# Patient Record
Sex: Female | Born: 1965 | Race: Asian | Hispanic: No | Marital: Married | Smoking: Never smoker
Health system: Southern US, Community
[De-identification: ages and names within clinical notes are randomized; demographics above are authoritative.]

## PROBLEM LIST (undated history)

## (undated) DIAGNOSIS — N939 Abnormal uterine and vaginal bleeding, unspecified: Secondary | ICD-10-CM

## (undated) DIAGNOSIS — E049 Nontoxic goiter, unspecified: Secondary | ICD-10-CM

## (undated) HISTORY — PX: BREAST BIOPSY: SHX20

## (undated) HISTORY — DX: Nontoxic goiter, unspecified: E04.9

## (undated) HISTORY — DX: Abnormal uterine and vaginal bleeding, unspecified: N93.9

## (undated) HISTORY — PX: APPENDECTOMY: SHX54

---

## 1994-05-18 HISTORY — PX: CARDIAC VALVE SURGERY: SHX40

## 2000-10-14 ENCOUNTER — Other Ambulatory Visit: Admission: RE | Admit: 2000-10-14 | Discharge: 2000-10-14 | Payer: Self-pay | Admitting: Obstetrics and Gynecology

## 2000-12-20 ENCOUNTER — Ambulatory Visit (HOSPITAL_COMMUNITY): Admission: RE | Admit: 2000-12-20 | Discharge: 2000-12-20 | Payer: Self-pay | Admitting: Obstetrics and Gynecology

## 2000-12-20 ENCOUNTER — Encounter: Payer: Self-pay | Admitting: Obstetrics and Gynecology

## 2001-03-28 ENCOUNTER — Inpatient Hospital Stay (HOSPITAL_COMMUNITY): Admission: AD | Admit: 2001-03-28 | Discharge: 2001-03-29 | Payer: Self-pay | Admitting: Obstetrics and Gynecology

## 2001-04-02 ENCOUNTER — Emergency Department (HOSPITAL_COMMUNITY): Admission: EM | Admit: 2001-04-02 | Discharge: 2001-04-02 | Payer: Self-pay | Admitting: Emergency Medicine

## 2003-03-14 ENCOUNTER — Inpatient Hospital Stay (HOSPITAL_COMMUNITY): Admission: AD | Admit: 2003-03-14 | Discharge: 2003-03-14 | Payer: Self-pay | Admitting: *Deleted

## 2004-06-10 ENCOUNTER — Inpatient Hospital Stay (HOSPITAL_COMMUNITY): Admission: EM | Admit: 2004-06-10 | Discharge: 2004-06-12 | Payer: Self-pay | Admitting: Emergency Medicine

## 2007-09-13 ENCOUNTER — Other Ambulatory Visit: Admission: RE | Admit: 2007-09-13 | Discharge: 2007-09-13 | Payer: Self-pay | Admitting: Obstetrics and Gynecology

## 2007-09-27 ENCOUNTER — Encounter: Payer: Self-pay | Admitting: Obstetrics & Gynecology

## 2007-09-27 ENCOUNTER — Ambulatory Visit (HOSPITAL_COMMUNITY): Admission: RE | Admit: 2007-09-27 | Discharge: 2007-09-27 | Payer: Self-pay | Admitting: Obstetrics & Gynecology

## 2010-09-30 NOTE — Op Note (Signed)
[name], [name]               ACCOUNT NO.:  [id number]   MEDICAL RECORD NO.:  [id number]          PATIENT TYPE:  AMB   LOCATION:  SDC                           FACILITY:  [facility]   PHYSICIAN:  M. Leda Quail, MD  DATE OF BIRTH:  1965/09/02   DATE OF PROCEDURE:  DATE OF DISCHARGE:                               OPERATIVE REPORT   PREOPERATIVE DIAGNOSIS:  1. A 45 year old G6, P3, AB3, [place] female with dysfunctional      uterine bleeding.  2. Thickened endometrium versus polyp noted on ultrasound.  3. 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   POSTOPERATIVE DIAGNOSIS:  1. A 44 year old G6, P3, AB3, [place] female with dysfunctional      uterine bleeding.  2. Thickened endometrium versus polyp noted on ultrasound.  3. 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   PROCEDURE:  Hysteroscopy with D&C.   SURGEON:  M. Leda Quail, MD.   ASSISTANT:  OR staff nurse.   ANESTHESIA:  General using LMA.   FINDINGS:  Thickened endometrium, on curetting this tissue almost  appeared fatty and there was a very large amount of it for a woman as  thin as Ms. Basista is.   SPECIMENS:  Endometrial curettings sent to pathology.   ESTIMATED BLOOD LOSS:  Minimal.   FLUIDS:  1000 mL of LR.   URINE OUTPUT:  200 mL of clear urine output.   DRAINS:  With an IMA catheterization at the very beginning of the  procedure.   FLUID DEFICIT:  A 100 mL of 3% sorbitol, which was the hysteroscopic  medium used for the procedure.   COMPLICATIONS:  None.   INDICATIONS:  Ms. Alvizo is a very nice 45 year old G6, P3, [place]  female who has had dysfunctional bleeding now for almost 2 months.  The  bleeding just has not stopped.  She has now become anemic as a result of  this.  She was evaluated with a sonohysterogram, which showed a  thickened  endometrium measuring 17 mm.  On sonohysterogram, there was  either a long cylindrical polyp or just a significant amount of  thickened endometrial tissue.  It was difficult to differentiate which  one on ultrasound.  Risks and benefits were discussed with the patient  in the office.  She does not speak [place].  She did have a friend who  was capable of translating.  She is with the patient today as well.  I  do feel good and informed consent was obtained in the office.  Postoperative pain medicine was provided to the patient.  She presents  for this procedure today and is ready to proceed.   PROCEDURE:  The patient was taken to the operating room.  She is placed  in supine position.  Anesthesia administered by the anesthesia staff  without difficulty.  Legs positioned in [place] stirrups in the low  lithotomy position.  Once the legs were positioned appropriately, there  are lifted to the high lithotomy position.  The perineum, thighs, and  vagina prepped and draped in normal sterile fashion.  Red rubber Foley  catheter was used to drain the bladder of all urine.  A heavy weighted  speculum was placed in the posterior aspect of the vagina.  The anterior  lip of cervix grasped with a single-tooth tenaculum.  A 10 mL of 1%  lidocaine was instilled in the cervix to place a paracervical block.  The uterus sounds 7.5 cm.  The cervix was then dilated with Shawnie Pons  dilators up to #23.  A 2.9-mm diagnostic hysteroscope was obtained.  A  3% sorbitol was used as the hysteroscope medium.  The hysteroscope was  passed through cervical os and into the cervical canal very easily.  A  thickened fluffy tissue was noted with some fairly vascular and  irregular appearing areas.  Photo documentation of this was made.  Tubal  ostia noted bilaterally and photo documentation was made.  The  hysteroscope was then removed.  A#1 toothed curette was obtained and the  entire endometrium was curetted to a rough gritty  texture was noted.  A  very significant amount of tissue was obtained.  A #1 smooth curette was  obtained and the cavity was curetted again until rough gritty texture  was noted, which was present at this point.  The hysteroscope was then  obtained and passed again into the endometrial cavity.  The thickened  tissue is now absent.  At this point, I felt the procedure should be  ended.  All instruments removed from the vagina.  The tenaculum was  removed from the anterior lip of the cervix.  One silver nitrate was  used to obtain hemostasis of the tenaculum site.  The patient tolerated  the procedure very well.  She was awake from anesthesia and taken to  recovery in stable condition.  Sponge, lap, needle, and instrument  counts are correct x2.  There were no needles used on the field.  A 15  mg of Toradol IV was given at this time.      Lum Keas, MD  Electronically Signed     MSM/MEDQ  D:  09/27/2007  T:  09/28/2007  Job:  [id number]

## 2010-10-03 NOTE — H&P (Signed)
[facility]  PatientCHENILLE, [name] Visit Number: [id number] MRN: [id number]          Service Type: OBS Location: 910B 9159 01 Attending Physician:  Jaymes Graff A Dictated by:   Mack Guise, C.N.M. Admit Date:  03/28/2001                           History and Physical  CONTINUATION  ABDOMEN:                      Gravid in its contour.  Uterine fundus is noted to extend 40 cm above the level of the pubis symphysis.  Leopolds maneuvers find the infant to be in a longitudinal lie, cephalic presentation, and the estimated fetal weight is 7.5 pounds.  PELVIC:                       Digital exam of the cervix finds it to be 3-4 cm dilated, 80% effaced, with the cephalic presenting part at a -2 station. Clear fluid is noted, fern positive.  EXTREMITIES:                  Show no pathologic edema.  DTRs are 1+ with no clonus.  ASSESSMENT:                   1. Intrauterine pregnancy at term.                               2. Early labor.  PLAN:                         1. Admit per Dr. Jaymes Graff.                               2. Routine M.D. orders.                               3. Penicillin G prophylaxis for unknown group B                                  strep status. Dictated by:   Mack Guise, C.N.M. Attending Physician:  Michael Litter DD:  03/28/01 TD:  03/28/01 Job: [id number]

## 2010-10-03 NOTE — H&P (Signed)
[facility]  PatientMALKA, [name] Visit Number: [id number] MRN: [id number]          Service Type: OBS Location: 910B 9159 01 Attending Physician:  Jaymes Graff A Dictated by:   Mack Guise, C.N.M. Admit Date:  03/28/2001                           History and Physical  HISTORY OF PRESENT ILLNESS:   [name] is a 45 year old gravida 6 para 2-0-3-2 at 8 weeks, EDD April 12, 2001 as determined by dates, confirmed with pregnancy ultrasonography, who presents in early labor with spontaneous rupture of membranes at approximately 3:15 a.m. today for clear fluid shortly after arrival.  Patient is [place] with very limited [place].  Husband is with patient and is acting as an Engineer, technical sales.  She reports positive fetal movement, no bleeding.  Her pregnancy has been followed by the M.D. service at [facility] and is remarkable for: 1. Advanced maternal age. 2. Language barrier. 3. Three ABs. 4. Heart surgery. 5. Questionable group B strep status.  This patient was initially evaluated at the office of [facility] at approximately 10 or [redacted] weeks gestation.  EDC determined by dates confirmed with pregnancy ultrasonography.  Her pregnancy has been essentially uncomplicated.  She has been normotensive with no proteinuria, size equal to dates throughout.  PRENATAL LABORATORY DATA:     On Oct 14, 2000:  Hemoglobin and hematocrit 12.4 and 38.2; platelets 240,000.  Blood type and Rh O positive, antibody screen negative.  Sickle cell trait negative.  VDRL nonreactive.  Rubella immune. Hepatitis B surface antigen negative.  HIV nonreactive.  Pap smear within normal limits.  GC and chlamydia negative.  One-hour glucose challenge at 28 weeks 135; three-hour GTT taken February 03, 2001 was 80, 136, 112, and 121 - which is within normal limits.  OBSTETRICAL HISTORY:          In 1990, elective AB with D&C.  In 1991, elective AB with D&C.  In 1993, a forceps  delivery in [place] with the birth of a 5 pound female infant at term.  In 1998, normal spontaneous vaginal delivery at term with the birth of a 7 pound female infant in [place] with no complications.  In 2000, elective AB.  In 2002, the present pregnancy.  MEDICAL HISTORY:              Significant for heart problems for which patient underwent heart surgery in 1996.  FAMILY HISTORY:               Patients brother was born with a hole in his heart.  GENETIC HISTORY:              That is the only problem of significance which has been stated - that patients brother has a hole in his heart.  SOCIAL HISTORY:               [name] is a 45 year old [place] married female.  Her husband is involved and supportive.  They do not subscribe to a religious faith.  ALLERGIES:                    No known drug allergies.  MEDICATIONS:                  She takes prenatal vitamins daily.  HABITS:  Denies the use of alcohol, tobacco, or illicit drugs.  REVIEW OF SYSTEMS:            There are no signs or symptoms suggestive of focal or systemic disease and the patient is typical of one with a uterine pregnancy at term in early labor.  PHYSICAL EXAMINATION:  VITAL SIGNS:                  Stable, afebrile.  HEENT:                        Unremarkable.  Thyroid not enlarged.  HEART:                        Regular rate and rhythm. Dictated by:   Mack Guise, C.N.M. Attending Physician:  Michael Litter DD:  03/28/01 TD:  03/28/01 Job: [id number]

## 2011-02-11 LAB — CBC
HCT: 42
Hemoglobin: 14.2
MCHC: 33.8
MCV: 91.7
Platelets: 247
RBC: 4.59
RDW: 12.4
WBC: 6.6

## 2011-02-11 LAB — URINALYSIS, ROUTINE W REFLEX MICROSCOPIC
Glucose, UA: NEGATIVE
Protein, ur: NEGATIVE
pH: 5

## 2011-02-11 LAB — URINE MICROSCOPIC-ADD ON

## 2011-12-29 ENCOUNTER — Ambulatory Visit (INDEPENDENT_AMBULATORY_CARE_PROVIDER_SITE_OTHER): Payer: BC Managed Care – PPO | Admitting: Family Medicine

## 2011-12-29 VITALS — BP 108/74 | HR 58 | Temp 98.0°F | Resp 16 | Ht <= 58 in | Wt 87.0 lb

## 2011-12-29 DIAGNOSIS — R21 Rash and other nonspecific skin eruption: Secondary | ICD-10-CM

## 2011-12-29 MED ORDER — METHYLPREDNISOLONE ACETATE 80 MG/ML IJ SUSP
80.0000 mg | Freq: Once | INTRAMUSCULAR | Status: AC
  Administered 2011-12-29: 80 mg via INTRAMUSCULAR

## 2011-12-29 NOTE — Progress Notes (Signed)
This is a 46 year old it is woman who comes in with itchy facial rest primarily on the right side and 4 had. She's tried hydrocortisone cream and hasn't really done much. She's had a rash, which began as a small area of irritation lateral to the right eye, for 4 days. It is gotten progressively worse. She has a few areas of itching on her neck but none on the arms hands or legs  She's had poison ivy in the past but she thinks this is a little bit different.  She's had no problems with cough, sore throat, abdominal pain, change in bowel pattern, joint pain.  Objective: Patient has confluent erythematous rash with mild induration over the right cheek and forehead areas. She has several papules of erythema on her neck as well.  Assessment: Contact dermatitis versus insect bite  Plan: Depo-Medrol 80 mg IM

## 2012-05-18 HISTORY — PX: BREAST BIOPSY: SHX20

## 2012-07-29 ENCOUNTER — Other Ambulatory Visit: Payer: Self-pay

## 2012-07-29 DIAGNOSIS — Z1231 Encounter for screening mammogram for malignant neoplasm of breast: Secondary | ICD-10-CM

## 2012-09-05 ENCOUNTER — Other Ambulatory Visit: Payer: Self-pay | Admitting: Obstetrics and Gynecology

## 2012-09-05 ENCOUNTER — Ambulatory Visit
Admission: RE | Admit: 2012-09-05 | Discharge: 2012-09-05 | Disposition: A | Discharge status: Home / Self Care | Payer: BC Managed Care – PPO | Source: Ambulatory Visit

## 2012-09-05 DIAGNOSIS — Z1231 Encounter for screening mammogram for malignant neoplasm of breast: Secondary | ICD-10-CM

## 2012-09-05 DIAGNOSIS — R928 Other abnormal and inconclusive findings on diagnostic imaging of breast: Secondary | ICD-10-CM

## 2012-10-06 ENCOUNTER — Ambulatory Visit
Admission: RE | Admit: 2012-10-06 | Discharge: 2012-10-06 | Disposition: A | Discharge status: Home / Self Care | Payer: BC Managed Care – PPO | Source: Ambulatory Visit | Attending: Obstetrics and Gynecology | Admitting: Obstetrics and Gynecology

## 2012-10-06 ENCOUNTER — Other Ambulatory Visit: Payer: Self-pay | Admitting: Obstetrics and Gynecology

## 2012-10-06 DIAGNOSIS — R928 Other abnormal and inconclusive findings on diagnostic imaging of breast: Secondary | ICD-10-CM

## 2012-10-14 ENCOUNTER — Ambulatory Visit
Admission: RE | Admit: 2012-10-14 | Discharge: 2012-10-14 | Disposition: A | Discharge status: Home / Self Care | Payer: BC Managed Care – PPO | Source: Ambulatory Visit | Attending: Obstetrics and Gynecology | Admitting: Obstetrics and Gynecology

## 2012-10-14 ENCOUNTER — Other Ambulatory Visit: Payer: Self-pay | Admitting: Obstetrics and Gynecology

## 2012-10-14 DIAGNOSIS — R928 Other abnormal and inconclusive findings on diagnostic imaging of breast: Secondary | ICD-10-CM

## 2012-11-10 ENCOUNTER — Encounter: Payer: Self-pay | Admitting: *Deleted

## 2012-11-14 ENCOUNTER — Ambulatory Visit (INDEPENDENT_AMBULATORY_CARE_PROVIDER_SITE_OTHER): Payer: BC Managed Care – PPO | Admitting: Certified Nurse Midwife

## 2012-11-14 ENCOUNTER — Encounter: Payer: Self-pay | Admitting: Certified Nurse Midwife

## 2012-11-14 VITALS — BP 110/76 | HR 76 | Resp 14 | Ht <= 58 in | Wt 88.0 lb

## 2012-11-14 DIAGNOSIS — E559 Vitamin D deficiency, unspecified: Secondary | ICD-10-CM

## 2012-11-14 DIAGNOSIS — Z0000 Encounter for general adult medical examination without abnormal findings: Secondary | ICD-10-CM

## 2012-11-14 DIAGNOSIS — E049 Nontoxic goiter, unspecified: Secondary | ICD-10-CM

## 2012-11-14 DIAGNOSIS — Z01419 Encounter for gynecological examination (general) (routine) without abnormal findings: Secondary | ICD-10-CM

## 2012-11-14 LAB — POCT URINALYSIS DIPSTICK
Urobilinogen, UA: NEGATIVE
pH, UA: 5

## 2012-11-14 LAB — TSH: TSH: 1.494 u[IU]/mL (ref 0.350–4.500)

## 2012-11-14 NOTE — Progress Notes (Signed)
47 y.o. G3P3 Married Asian Fe here for annual exam. Patient has interpreter for her today. Periods normal this year, no problems. Condoms or infrequent sexual activity works well for contraception. Continues with electric shock sensation down legs occasionally, no change. Wears sandals to work then removes shoes while working, but stands on Agricultural consultant at work. Has lab work with employer, but has not been drawn this year yet.Patient had possible mass in right breast per mammogram, had biopsy one month ago per patient. All normal and marker put in to identify area, per patient. Per patient follow up one year.  No other health issues.     No LMP recorded.          Sexually active: no  The current method of family planning is none.    Exercising: yes  Gardening, routines at home Smoker:  no  Health Maintenance: Pap:  11/12/11- NEG HR HPV  MMG:  10/06/12 Colonoscopy:  no BMD:   no TDaP:  6/27/113 Labs:  Work          Urine: Normal   reports that she has never smoked. She does not have any smokeless tobacco history on file. She reports that she does not drink alcohol or use illicit drugs.  Past Medical History  Diagnosis Date  . Enlarged thyroid   . Abnormal uterine bleeding     resolved    Past Surgical History  Procedure Laterality Date  . Appendectomy    . Abdominal hysterectomy    . Cardiac valve surgery      Current Outpatient Prescriptions  Medication Sig Dispense Refill  . Vitamin D, Ergocalciferol, (DRISDOL) 50000 UNITS CAPS Take 50,000 Units by mouth once a week.       No current facility-administered medications for this visit.    Family History  Problem Relation Age of Onset  . Diabetes Mother     ROS:  Pertinent items are noted in HPI.  Otherwise, a comprehensive ROS was negative.  Exam:   BP 110/76  Pulse 76  Resp 14  Ht 4\' 10"  (1.473 m)  Wt 88 lb (39.917 kg)  BMI 18.4 kg/m2 Height: 4\' 10"  (147.3 cm)  Ht Readings from Last 3 Encounters:  11/14/12 4\' 10"   (1.473 m)  12/29/11 4\' 9"  (1.448 m)    General appearance: alert, cooperative and appears stated age Head: Normocephalic, without obvious abnormality, atraumatic Neck: no adenopathy, supple, symmetrical, trachea midline and thyroid enlarged bilateral, no nodules palpated(known enlargement, no size change) Lungs: clear to auscultation bilaterally Breasts: normal appearance, no masses or tenderness, No nipple retraction or dimpling, No nipple discharge or bleeding, No axillary or supraclavicular adenopathy, tiny scar noted on right breast at 1 o'clock 2 fb from aerola Heart: regular rate and rhythm Abdomen: soft, non-tender; no masses,  no organomegaly Extremities: extremities normal, atraumatic, no cyanosis or edema Skin: Skin color, texture, turgor normal. No rashes or lesions Lymph nodes: Cervical, supraclavicular, and axillary nodes normal. No abnormal inguinal nodes palpated Neurologic: Grossly normal, ambulates without difficulty   Pelvic: External genitalia:  no lesions              Urethra:  normal appearing urethra with no masses, tenderness or lesions              Bartholin's and Skene's: normal                 Vagina: normal appearing vagina with normal color and discharge, no lesions  Cervix: absent              Pap taken: no Bimanual Exam:  Uterus:  uterus absent              Adnexa: normal adnexa and no mass, fullness, tenderness               Rectovaginal: Confirms               Anus:  normal sphincter tone, no lesions  A:  Well Woman with normal exam  Contraception: condoms  Communication through interpreter  Known Enlarged thyroid, no size change   Right breast biopsy, due to mass on mammogram, 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    Discussed thyroid still enlarged, but no change.   Lab:  TSH  Discussed pathology report and need to repeat mammogram in one year and continue SBE  [id number] D  Form for work completed. Request copy of labs performed at work sent to our office  Pap smear as per guidelines   Mammogram yearly as recommended pap smear not taken today  counseled on breast self exam, mammography screening, adequate intake of calcium and vitamin D, diet and exercise Discussed wearing appropriate foot wear at work may change the electrical feeling she has noted. If continues or increases needs to see neurology. Patient voiced understanding with interpreter.  return annually or prn  An After Visit Summary was printed and given to the patient.

## 2012-11-14 NOTE — Patient Instructions (Addendum)

## 2012-11-15 LAB — VITAMIN D 25 HYDROXY (VIT D DEFICIENCY, FRACTURES): Vit D, 25-Hydroxy: 26 ng/mL — ABNORMAL LOW (ref 30–89)

## 2012-11-16 NOTE — Progress Notes (Signed)
Reviewed CPRomine, MD 

## 2012-11-28 ENCOUNTER — Telehealth: Payer: Self-pay | Admitting: *Deleted

## 2012-11-28 NOTE — Telephone Encounter (Signed)
Patient daughter , [name], who speaks for her mother. [name], pateint. Given report of mammogram per Dr. Tresa Res. Results given to Selena Batten of mass benign, and to repeat mammogram in one year in May, 2015.

## 2012-11-28 NOTE — Telephone Encounter (Signed)
Tell pt mass was benign, not scary, and to repeat a mammogram next May.

## 2012-11-28 NOTE — Telephone Encounter (Signed)
See scanned report in [facility] 10/06/2012. OUT OF HOLD per Dr. Tresa Res and D. Leonard/ placed in recall for one year/ 09/06/2013/ sue/

## 2013-03-02 ENCOUNTER — Ambulatory Visit (INDEPENDENT_AMBULATORY_CARE_PROVIDER_SITE_OTHER): Payer: BC Managed Care – PPO | Admitting: Emergency Medicine

## 2013-03-02 VITALS — BP 90/50 | HR 58 | Temp 98.0°F | Resp 16 | Ht <= 58 in | Wt 89.0 lb

## 2013-03-02 DIAGNOSIS — R21 Rash and other nonspecific skin eruption: Secondary | ICD-10-CM

## 2013-03-02 MED ORDER — BETAMETHASONE DIPROPIONATE AUG 0.05 % EX CREA: TOPICAL_CREAM | Freq: Two times a day (BID) | CUTANEOUS | Status: DC

## 2013-03-02 MED ORDER — METHYLPREDNISOLONE ACETATE 80 MG/ML IJ SUSP
40.0000 mg | Freq: Once | INTRAMUSCULAR | Status: AC
  Administered 2013-03-02: 40 mg via INTRAMUSCULAR

## 2013-03-02 NOTE — Patient Instructions (Signed)
Vim Da Ti?p Xc (Contact Dermatitis) Vim da ti?p xc l ph?n ?ng v?i cc ch?t nh?t ??nh ti?p xc v?i da. Vim da ti?p xc c th? l vim da do ti?p xc v?i ch?t gy kch thch, vim da ti?p xc d? ?ng. Vim da do ti?p xc v?i ch?t gy kch thch khng c?n ti?p xc tr??c ? v?i ch?t ?? m?t ph?n ?ng x?y ra. Vim da ti?p xc d? ?ng ch? x?y ra n?u b?n ? ti?p xc v?i ch?t tr??c ?. Khi ti?p xc l?p l?i, c? th? c?a b?n ph?n ?ng v?i ch?t ?.  NGUYN NHN  Nhi?u ch?t c th? gy ra vim da ti?p xc. Vim da do ti?p xc v?i ch?t kch thch ph? bi?n nh?t gy ra do ti?p xc l?p ?i l?p l?i v?i ch?t kch thch nh?, ch?ng h?n nh?:   Trang ?i?m.  X phng.  Ch?t t?y r?a.  Ch?t t?y tr?ng.  Axit.  Mu?i kim lo?i, ch?ng h?n nh? niken. Vim da ti?p xc d? ?ng ph? bi?n nh?t l do ti?p xc v?i:   Th?c v?t ??c.  Ha ch?t (ch?t kh? mi, d?u g?i).  ?? trang s?c.  Jerrilyn [place] su.  Neomycin trong kem khng sinh ba tc d?ng.  Ch?t b?o qu?n trong cc s?n ph?m, bao g?m c? qu?n o. TRI?U CH?NG  Vng da b? ti?p xc c th? pht tri?n:   Kh ho?c bong.  ??.  N?t.  Ng?a.  ?au ho?c c?m gic rt.  M?n m?. V?i vim da ti?p xc d? ?ng, c?ng c th? b? s?ng trong nh?ng vng nh? m m?t, mi?ng ho?c b? ph?n sinh d?c.  CH?N ?ON  Chuyn gia ch?m [place] y t? c?a b?n th??ng c th? xc ??nh v?n ?? b?ng cch khm tr?c ti?p. Trong tr??ng h?p khng ch?c ch?n nguyn nhn v nghi ng? b?nh vim da ti?p xc d? ?ng, xt nghi?m mi?ng da c th? ???c th?c hi?n ?? gip xc ??nh nguyn nhn vim da.  ?I?U TR?  ?i?u tr? bao g?m b?o v? da trnh ti?p xc thm v?i ch?t kch thch b?ng cch trnh cc ch?t ? n?u c th?. B?t, kem ch?n v g?ng tay c th? h?u ch. Chuyn gia ch?m [place] y t? c?a b?n c?ng c th? ?? ngh?:   Thao kem ho?c thu?c m? steroid 2 l?n m?i ngy. ?? c k?t qu? t?t nh?t, hy ngm vng pht ban vo n??c l?nh trong 20 pht. Sau ? thoa thu?c. Che vng ny b?ng b?c nh?a. B?n c th? l?u tr? kem steroid trong t? l?nh ?? c  hi?u ?ng "l?nh" cho ch? pht ban c?a b?n. Cch ? c th? lm gi?m ng?a. Thu?c steroid dng ?? u?ng c th? c?n thi?t trong tr??ng h?p nghim tr?ng.  Thu?c khng sinh ho?c thu?c m? khng khu?n n?u xu?t hi?n nhi?m trng da.  Kem d??ng da khng histamin ho?c thu?c khng histamin dng ?? u?ng ?? gi?m b?t ng?a.  D?u ?? gi? ?? ?m trong da.  Dung d?ch burow ?? gi?m t?y ?? v ?au nh?c ho?c ?? lm kh pht ban ??t. Tr?n m?t gi ho?c vin dung d?ch trong 2 chn n??c l?nh. Nhng m?t chi?c kh?n s?ch vo h?n h?p, v?t m?t cht v ??t n ln vng b? ?nh h??ng. ?? kh?n t?i ch? trong 30 pht. Lm ?i?u ny cng nhi?u l?n cng t?t trong su?t c? ngy.  T?m b?t b?p ho?c so?a bicacbonat hng ngy n?u vng ny l qu l?n ?? che b?ng  kh?n m?t. Ha ch?t m?nh, ch?ng h?n nh? ch?t ki?m ho?c axit, c th? gy t?n th??ng da gi?ng nh? b?ng. B?n c?n x? n??c vo da mnh t? 15 ??n 20 pht b?ng n??c l?nh sau khi ti?p xc. B?n c?ng nn ngay l?p t?c tham v?n v?i chuyn gia y t? sau khi ti?p xc. B?ng, thu?c khng sinh v thu?c gi?m ?au c th? c?n thi?t cho da b? kch thch.  H??NG D?N CH?M [place] NH   Trnh ch?t gy ra ph?n ?ng c?a b?n.  Gi? vng da b? ?nh h??ng trnh xa n??c nng, x phng, nh sng m?t tr?i, ha ch?t, cc ch?t c tnh axit ho?c b?t c? th? g khc c th? gy kch ?ng da c?a b?n.  Khng gi ch? pht ban. Gi c th? khi?n cho ch? pht ban b? nhi?m trng.  B?n c th? t?m mt ?? gip ng?n ng?a.  Ch? s? d?ng thu?c mua tr?c ti?p t?i hi?u thu?c ho?c thu?c k toa theo ch? d?n c?a chuyn gia ch?m [place] y t?.  G?p chuyn gia ch?m [place] y t? c?a b?n ?? khm l?i theo ch? d?n ?? ??m b?o da c?a b?n ???c ch?a tr? ?ng cch. HY THAM V?N V?I CHUYN GIA Y T? N?U:   Tnh tr?ng c?a b?n khng c?i thi?n sau 3 ngy ?i?u tr?Marland Kitchen  B?n d??ng nh? tr? nn t? h?n.  B?n th?y c d?u hi?u nhi?m trng nh? s?ng, ?au, ??, ?au nh?c ho?c ?m ? vng b? ?nh h??ng.  B?n c b?t k? v?n ?? no lin quan ??n thu?c. Document Released: 02/11/2005  Document Revised: 07/27/2011 [facility] Patient Information 2014 [place], [place].

## 2013-03-02 NOTE — Progress Notes (Signed)
  Subjective:    Patient ID: [name], female    DOB: July 24, 1965, 47 y.o.   MRN: [id number]  HPI Pt presents with rash on her right side. Language barrier. Rash is also spotty other places, but mainly on right side. Started on her face. Pt reports she has this every year this is a recurrent problem. She has a rash primarily on the right side of her abdomen. She has a slight rash on the back of her neck. History was taken through the interpretation service because the patient does not speak [place]..     Review of Systems     Objective:   Physical Exam patient is alert and cooperative. Neck is supple. Chest is clear. Heart exam reveals a loud P2. There is a linear macular papular rash right upper abdomen. There is also a small raised maculopapular rash on the back of the neck the        Assessment & Plan:  This is a contact dermatitis of her abdomen and neck. She is given 40 of Depo-Medrol IM. She was also given a prescription for Diprolene cream

## 2013-12-12 ENCOUNTER — Other Ambulatory Visit: Payer: Self-pay | Admitting: Certified Nurse Midwife

## 2013-12-12 ENCOUNTER — Other Ambulatory Visit: Payer: Self-pay

## 2013-12-12 ENCOUNTER — Ambulatory Visit (INDEPENDENT_AMBULATORY_CARE_PROVIDER_SITE_OTHER): Payer: BC Managed Care – PPO | Admitting: Certified Nurse Midwife

## 2013-12-12 ENCOUNTER — Encounter: Payer: Self-pay | Admitting: Certified Nurse Midwife

## 2013-12-12 VITALS — BP 110/70 | HR 70 | Resp 16 | Ht <= 58 in | Wt 91.0 lb

## 2013-12-12 DIAGNOSIS — Z0000 Encounter for general adult medical examination without abnormal findings: Secondary | ICD-10-CM

## 2013-12-12 DIAGNOSIS — Z01419 Encounter for gynecological examination (general) (routine) without abnormal findings: Secondary | ICD-10-CM

## 2013-12-12 DIAGNOSIS — E049 Nontoxic goiter, unspecified: Secondary | ICD-10-CM

## 2013-12-12 DIAGNOSIS — E559 Vitamin D deficiency, unspecified: Secondary | ICD-10-CM

## 2013-12-12 DIAGNOSIS — Z1231 Encounter for screening mammogram for malignant neoplasm of breast: Secondary | ICD-10-CM

## 2013-12-12 LAB — POCT URINALYSIS DIPSTICK
BILIRUBIN UA: NEGATIVE
Blood, UA: NEGATIVE
GLUCOSE UA: NEGATIVE
Ketones, UA: NEGATIVE
LEUKOCYTES UA: NEGATIVE
NITRITE UA: NEGATIVE
Protein, UA: NEGATIVE
Urobilinogen, UA: NEGATIVE
pH, UA: 5

## 2013-12-12 LAB — HEMOGLOBIN, FINGERSTICK: HEMOGLOBIN, FINGERSTICK: 14.6 g/dL (ref 12.0–16.0)

## 2013-12-12 NOTE — Progress Notes (Signed)
[facility] 9[facility]30[facility]. Patient is agreeable and verbalizes understanding.

## 2013-12-12 NOTE — Progress Notes (Signed)
48 y.o. G3P3 Married Asian Fe here for annual exam. Patient has good year.Intrepreter(female) here with patient. Patient denies vaginal bleeding or vaginal dryness. Denies abdominal pain or problems today. Patient had lab work at work and forgot to bring for visit, but "all good" Patient no longer has pain down legs when working due to wearing support shoes now instead of sandals. No other health issues today. She sees dentist yearly.  Patient's last menstrual period was 11/01/2013.          Sexually active: No.  The current method of family planning is abstinence.    Exercising: No.  exercise Smoker:  no  Health Maintenance: Pap: 11-12-11 neg HPV HR neg MMG: 09-05-12 breast density category  Colonoscopy:  none BMD:   none TDaP:  2013 Labs: Poct urine-neg, Hgb-14.6 Self breast exam: not done   reports that she has never smoked. She does not have any smokeless tobacco history on file. She reports that she does not drink alcohol or use illicit drugs.  Past Medical History  Diagnosis Date  . Enlarged thyroid   . Abnormal uterine bleeding     resolved    Past Surgical History  Procedure Laterality Date  . Appendectomy    . Abdominal hysterectomy    . Cardiac valve surgery      No current outpatient prescriptions on file.   No current facility-administered medications for this visit.    Family History  Problem Relation Age of Onset  . Diabetes Mother     ROS:  Pertinent items are noted in HPI.  Otherwise, a comprehensive ROS was negative.  Exam:   BP 110/70  Pulse 70  Resp 16  Ht 4' 9.75" (1.467 m)  Wt 91 lb (41.277 kg)  BMI 19.18 kg/m2  LMP 11/01/2013 Height: 4' 9.75" (146.7 cm)  Ht Readings from Last 3 Encounters:  12/12/13 4' 9.75" (1.467 m)  03/02/13 4\' 10"  (1.473 m)  11/14/12 4\' 10"  (1.473 m)    General appearance: alert, cooperative and appears stated age Head: Normocephalic, without obvious abnormality, atraumatic Neck: no adenopathy, supple, symmetrical,  trachea midline and thyroid known enlargement with no size change or nodules noted Lungs: clear to auscultation bilaterally Breasts: normal appearance, no masses or tenderness, No nipple retraction or dimpling, No nipple discharge or bleeding, No axillary or supraclavicular adenopathy Heart: regular rate and rhythm Abdomen: soft, non-tender; no masses,  no organomegaly Extremities: extremities normal, atraumatic, no cyanosis or edema Skin: Skin color, texture, turgor normal. No rashes or lesions Lymph nodes: Cervical, supraclavicular, and axillary nodes normal. No abnormal inguinal nodes palpated Neurologic: Grossly normal   Pelvic: External genitalia:  no lesions              Urethra:  normal appearing urethra with no masses, tenderness or lesions              Bartholin's and Skene's: normal                 Vagina: normal appearing vagina with normal color and discharge, no lesions              Cervix: absent              Pap taken: No. Bimanual Exam:  Uterus:  uterus absent              Adnexa: normal adnexa and no mass, fullness, tenderness               Rectovaginal: Confirms  Anus:  normal sphincter tone, no lesions  A:  Well Woman with normal exam  Perimenopause S/P TAH with ovaries retained  Enlarged thyroid, no size change from previous exam  History of Vitamin D deficiency    P:   Reviewed health and wellness pertinent to exam  Lab: TSH, Vitamin D  Pap smear not taken today   counseled on breast self exam, mammography screening, adequate intake of calcium and vitamin D, diet and exercise  return annually or prn  An After Visit Summary was printed and given to the patient.

## 2013-12-13 LAB — TSH: TSH: 1.213 u[IU]/mL (ref 0.350–4.500)

## 2013-12-13 NOTE — Progress Notes (Signed)
Reviewed personally.  M. Suzanne Amiaya Mcneeley, MD.  

## 2013-12-14 ENCOUNTER — Ambulatory Visit: Payer: BC Managed Care – PPO | Admitting: Certified Nurse Midwife

## 2013-12-14 LAB — VITAMIN D 25 HYDROXY (VIT D DEFICIENCY, FRACTURES): VIT D 25 HYDROXY: 31 ng/mL (ref 30–89)

## 2013-12-15 ENCOUNTER — Ambulatory Visit
Admission: RE | Admit: 2013-12-15 | Discharge: 2013-12-15 | Disposition: A | Discharge status: Home / Self Care | Payer: BC Managed Care – PPO | Source: Ambulatory Visit | Attending: Certified Nurse Midwife | Admitting: Certified Nurse Midwife

## 2013-12-15 DIAGNOSIS — Z1231 Encounter for screening mammogram for malignant neoplasm of breast: Secondary | ICD-10-CM

## 2014-03-19 ENCOUNTER — Encounter: Payer: Self-pay | Admitting: Certified Nurse Midwife

## 2014-12-24 ENCOUNTER — Ambulatory Visit: Payer: BC Managed Care – PPO | Admitting: Certified Nurse Midwife

## 2015-01-07 ENCOUNTER — Ambulatory Visit (INDEPENDENT_AMBULATORY_CARE_PROVIDER_SITE_OTHER): Payer: BLUE CROSS/BLUE SHIELD | Admitting: Obstetrics and Gynecology

## 2015-01-07 ENCOUNTER — Encounter: Payer: Self-pay | Admitting: Obstetrics and Gynecology

## 2015-01-07 VITALS — BP 122/78 | HR 68 | Resp 14 | Ht <= 58 in | Wt 90.0 lb

## 2015-01-07 DIAGNOSIS — Z01419 Encounter for gynecological examination (general) (routine) without abnormal findings: Secondary | ICD-10-CM | POA: Diagnosis not present

## 2015-01-07 DIAGNOSIS — Z0000 Encounter for general adult medical examination without abnormal findings: Secondary | ICD-10-CM | POA: Diagnosis not present

## 2015-01-07 DIAGNOSIS — Z1239 Encounter for other screening for malignant neoplasm of breast: Secondary | ICD-10-CM | POA: Diagnosis not present

## 2015-01-07 DIAGNOSIS — Z124 Encounter for screening for malignant neoplasm of cervix: Secondary | ICD-10-CM | POA: Diagnosis not present

## 2015-01-07 LAB — POCT URINALYSIS DIPSTICK
Bilirubin, UA: NEGATIVE
Glucose, UA: NEGATIVE
KETONES UA: NEGATIVE
Leukocytes, UA: NEGATIVE
Nitrite, UA: NEGATIVE
PH UA: 6.5
PROTEIN UA: NEGATIVE
RBC UA: NEGATIVE
Urobilinogen, UA: NEGATIVE

## 2015-01-07 LAB — LIPID PANEL
Cholesterol: 214 mg/dL — ABNORMAL HIGH (ref 125–200)
HDL: 83 mg/dL (ref >=46)
LDL CALC: 116 mg/dL (ref <130)
TRIGLYCERIDES: 77 mg/dL (ref <150)
Total CHOL/HDL Ratio: 2.6 Ratio (ref <=5.0)
VLDL: 15 mg/dL (ref <30)

## 2015-01-07 LAB — TSH: TSH: 1.251 u[IU]/mL (ref 0.350–4.500)

## 2015-01-07 NOTE — Patient Instructions (Signed)

## 2015-01-07 NOTE — Addendum Note (Signed)
Addended by: Joeseph Amor on: 01/07/2015 09:42 AM   Modules accepted: Orders

## 2015-01-07 NOTE — Progress Notes (Signed)
Patient ID: [name], female   DOB: Sep 30, 1965, 49 y.o.   MRN: [id number] 49 y.o. [name] here for annual exam.  Chart states h/o hysterectomy, patient states not true. Menses q month x 3-4 days. Light flow. No cramps. No BTB. Sexually active, natural family planning. Slight dyspareunia, hasn't tried a lubricant.   Patient's last menstrual period was 12/17/2014.          Sexually active: Yes.    The current method of family planning is natural family planning.    Exercising: Yes.    walking Smoker:  no  Health Maintenance: Pap:  11-12-11 WNL NEG HR HPV History of abnormal Pap:  no MMG:  12-18-13 WNL  Colonoscopy:  N/A BMD:   N/A TDaP:  11-12-11  Screening Labs: labs drawn today , Hb today: 14.8, Urine today: WNL    reports that she has never smoked. She has never used smokeless tobacco. She reports that she does not drink alcohol or use illicit drugs.  Past Medical History  Diagnosis Date  . Enlarged thyroid   . Abnormal uterine bleeding     resolved    Past Surgical History  Procedure Laterality Date  . Appendectomy    . Cardiac valve surgery      Current Outpatient Prescriptions  Medication Sig Dispense Refill  . Multiple Vitamin (MULTIVITAMIN) capsule Take 1 capsule by mouth daily.     No current facility-administered medications for this visit.    Family History  Problem Relation Age of Onset  . Diabetes Mother     ROS:  Pertinent items are noted in HPI.  Otherwise, a comprehensive ROS was negative.  Exam:   BP 122/78 mmHg  Pulse 68  Resp 14  Ht 4' 9.75" (1.467 m)  Wt 90 lb (40.824 kg)  BMI 18.97 kg/m2  LMP 12/17/2014  Weight change: @ Height:   Height: 4' 9.75" (146.7 cm)  Ht Readings from Last 3 Encounters:  01/07/15 4' 9.75" (1.467 m)  12/12/13 4' 9.75" (1.467 m)  03/02/13  (1.473 m)    General appearance: alert, cooperative and appears stated age Head: Normocephalic, without obvious abnormality, atraumatic Neck: no  adenopathy, supple, symmetrical, trachea midline and thyroid normal to inspection and palpation, previously described as enlarged (normal today) Lungs: clear to auscultation bilaterally Breasts: normal appearance, no masses or tenderness Heart: regular rate and rhythm Abdomen: soft, non-tender; bowel sounds normal; no masses,  no organomegaly Extremities: extremities normal, atraumatic, no cyanosis or edema Skin: Skin color, texture, turgor normal. No rashes or lesions Lymph nodes: Cervical, supraclavicular, and axillary nodes normal. No abnormal inguinal nodes palpated Neurologic: Grossly normal   Pelvic: External genitalia:  no lesions              Urethra:  normal appearing urethra with no masses, tenderness or lesions              Bartholins and Skenes: normal                 Vagina: normal appearing vagina with normal color and discharge, no lesions              Cervix: no lesions              Pap taken: Yes.   Bimanual Exam:  Uterus:  normal size, contour, position, consistency, mobility, non-tender and anteverted              Adnexa: normal adnexa and no mass, fullness, tenderness  Rectovaginal: Confirms               Anus:  normal sphincter tone, no lesions  Chaperone was present for exam.  A:  Well Woman with normal exam  P:   Mammogram (we will schedule for her)  No pap needed  TSH, vit D, lipids

## 2015-01-08 LAB — HEMOGLOBIN, FINGERSTICK: Hemoglobin, fingerstick: 14.8 g/dL (ref 12.0–16.0)

## 2015-01-08 LAB — VITAMIN D 25 HYDROXY (VIT D DEFICIENCY, FRACTURES): Vit D, 25-Hydroxy: 25 ng/mL — ABNORMAL LOW (ref 30–100)

## 2015-01-09 LAB — IPS PAP TEST WITH HPV

## 2015-01-16 ENCOUNTER — Ambulatory Visit: Payer: Self-pay | Admitting: Certified Nurse Midwife

## 2015-01-28 ENCOUNTER — Ambulatory Visit
Admission: RE | Admit: 2015-01-28 | Discharge: 2015-01-28 | Disposition: A | Discharge status: Home / Self Care | Payer: BLUE CROSS/BLUE SHIELD | Source: Ambulatory Visit | Attending: Obstetrics and Gynecology | Admitting: Obstetrics and Gynecology

## 2015-01-28 DIAGNOSIS — Z1239 Encounter for other screening for malignant neoplasm of breast: Secondary | ICD-10-CM

## 2015-03-18 ENCOUNTER — Ambulatory Visit (INDEPENDENT_AMBULATORY_CARE_PROVIDER_SITE_OTHER): Payer: BLUE CROSS/BLUE SHIELD | Admitting: Family Medicine

## 2015-03-18 VITALS — BP 122/76 | HR 78 | Temp 98.3°F | Resp 16 | Ht <= 58 in | Wt 90.4 lb

## 2015-03-18 DIAGNOSIS — R030 Elevated blood-pressure reading, without diagnosis of hypertension: Secondary | ICD-10-CM

## 2015-03-18 DIAGNOSIS — N912 Amenorrhea, unspecified: Secondary | ICD-10-CM | POA: Diagnosis not present

## 2015-03-18 DIAGNOSIS — R232 Flushing: Secondary | ICD-10-CM

## 2015-03-18 DIAGNOSIS — E049 Nontoxic goiter, unspecified: Secondary | ICD-10-CM

## 2015-03-18 LAB — COMPLETE METABOLIC PANEL WITHOUT GFR
ALT: 12 U/L (ref 6–29)
AST: 20 U/L (ref 10–35)
Albumin: 4.7 g/dL (ref 3.6–5.1)
Alkaline Phosphatase: 64 U/L (ref 33–115)
BUN: 9 mg/dL (ref 7–25)
Calcium: 9.1 mg/dL (ref 8.6–10.2)
Chloride: 102 mmol/L (ref 98–110)
Creat: 0.57 mg/dL (ref 0.50–1.10)
Glucose, Bld: 83 mg/dL (ref 65–99)
Sodium: 140 mmol/L (ref 135–146)

## 2015-03-18 LAB — POCT URINALYSIS DIP (MANUAL ENTRY)
Bilirubin, UA: NEGATIVE
Blood, UA: NEGATIVE
Glucose, UA: NEGATIVE
Ketones, POC UA: NEGATIVE
Leukocytes, UA: NEGATIVE
Nitrite, UA: NEGATIVE
Protein Ur, POC: NEGATIVE
Spec Grav, UA: 1.02
Urobilinogen, UA: 0.2
pH, UA: 7

## 2015-03-18 LAB — POCT CBC
Granulocyte percent: 56.8 %G (ref 37–80)
HCT, POC: 44.9 % (ref 37.7–47.9)
Hemoglobin: 15.1 g/dL (ref 12.2–16.2)
Lymph, poc: 2.5 (ref 0.6–3.4)
MCH, POC: 30.4 pg (ref 27–31.2)
MCHC: 33.7 g/dL (ref 31.8–35.4)
MCV: 90.2 fL (ref 80–97)
MID (cbc): 0.5 (ref 0–0.9)
MPV: 8.8 fL (ref 0–99.8)
POC Granulocyte: 3.9 (ref 2–6.9)
POC LYMPH PERCENT: 36.6 %L (ref 10–50)
POC MID %: 6.6 % (ref 0–12)
Platelet Count, POC: 203 10*3/uL (ref 142–424)
RBC: 4.98 M/uL (ref 4.04–5.48)
RDW, POC: 12.5 %
WBC: 6.9 10*3/uL (ref 4.6–10.2)

## 2015-03-18 LAB — POC MICROSCOPIC URINALYSIS (UMFC): Mucus: ABSENT

## 2015-03-18 LAB — COMPLETE METABOLIC PANEL WITH GFR
CO2: 29 mmol/L (ref 20–31)
GFR, Est African American: >89 mL/min (ref >=60)
GFR, Est Non African American: >89 mL/min (ref >=60)
Potassium: 4.9 mmol/L (ref 3.5–5.3)
Total Bilirubin: 0.4 mg/dL (ref 0.2–1.2)
Total Protein: 7.2 g/dL (ref 6.1–8.1)

## 2015-03-18 LAB — POCT URINE PREGNANCY: Preg Test, Ur: NEGATIVE

## 2015-03-18 LAB — TSH: TSH: 0.787 u[IU]/mL (ref 0.350–4.500)

## 2015-03-18 NOTE — Progress Notes (Signed)
Chief Complaint:  Chief Complaint  Patient presents with  . blood pressure check    x 2 days ago while walking she felt hot and unsteady and then decided to check it, top number was 151 unsure of what bottom was    HPI: [name] is a 49 y.o. female who [facility] today complaining of feeling flushed in the face so she went over to her neighbor's house to get her BP measures and it was high about 2 days ago. She is doing well. No other sxs She has a hx of enlarged thyroid but that has been worked up and it was negative, she is perimenopausal , has not had a period in 2 months. She has had spotting prior to that. She deneis any cough, nt sweats , unintentional weight loss. She recently had biometeric labs done and showed her cholesterol was marginally high. She denies DM, denies thyroid disease, denies CAD however she has had heart surgery for a ? Congenital defect hat worsen as she got older, this was done in [place]. She denis any SOb, CP, Zakry Caso edema, DOE, palpitations.   BP Readings from Last 3 Encounters:  03/18/15 122/76  01/07/15 122/78  12/12/13 110/70    Past Medical History  Diagnosis Date  . Enlarged thyroid   . Abnormal uterine bleeding     resolved   Past Surgical History  Procedure Laterality Date  . Appendectomy    . 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 Marital Status: Married    Spouse Name: N/A  . Number of Children: N/A  . Years of Education: N/A   Social History Main Topics  . Smoking status: Never Smoker   . Smokeless tobacco: Never Used  . Alcohol Use: No  . Drug Use: No  . Sexual Activity:    Partners: Male    Birth Control/ Protection: Abstinence   Other Topics Concern  . None   Social History Narrative   Family History  Problem Relation Age of Onset  . Diabetes Mother    No Known Allergies Prior to Admission medications    Medication Sig Start Date End Date Taking? Authorizing Provider  Multiple Vitamin (MULTIVITAMIN) capsule Take 1 capsule by mouth daily.    Historical Provider, MD     ROS: The patient denies fevers, chills, night sweats, unintentional weight loss, chest pain, palpitations, wheezing, dyspnea on exertion, nausea, vomiting, abdominal pain, dysuria, hematuria, melena, numbness, weakness, or tingling.   All other systems have been reviewed and were otherwise negative with the exception of those mentioned in the HPI and as above.    PHYSICAL EXAM: Filed Vitals:   03/18/15 1344  BP: 122/76  Pulse: 78  Temp: 98.3 F (36.8 C)  Resp: 16   Body mass index is 19.56 kg/(m^2).   General: Alert, no acute distress HEENT:  Normocephalic, atraumatic, oropharynx patent. EOMI, PERRLA Cardiovascular:  Regular rate and rhythm, no rubs murmurs or gallops.  No Carotid bruits, radial pulse intact. No pedal edema.  Respiratory: Clear to auscultation bilaterally.  No wheezes, rales, or rhonchi.  No cyanosis, no use of accessory musculature Abdominal: No organomegaly, abdomen is soft and non-tender, positive bowel sounds. No masses. Skin: No rashes. + thoracotamy scar Neurologic: Facial musculature symmetric. Psychiatric: Patient acts appropriately throughout our interaction. Lymphatic: No cervical or submandibular lymphadenopathy  Musculoskeletal: Gait intact. No edema, tenderness   LABS: Results for orders placed or performed in visit on 03/18/15  POCT CBC  Result Value Ref Range   WBC 6.9 4.6 - 10.2 K/uL   Lymph, poc 2.5 0.6 - 3.4   POC LYMPH PERCENT 36.6 10 - 50 %L   MID (cbc) 0.5 0 - 0.9   POC MID % 6.6 0 - 12 %M   POC Granulocyte 3.9 2 - 6.9   Granulocyte percent 56.8 37 - 80 %G   RBC 4.98 4.04 - 5.48 M/uL   Hemoglobin 15.1 12.2 - 16.2 g/dL   HCT, POC 16.1 09.6 - 47.9 %   MCV 90.2 80 - 97 fL   MCH, POC 30.4 27 - 31.2 pg   MCHC 33.7 31.8 - 35.4 g/dL   RDW, POC 04.5 %   Platelet Count,  POC 203 142 - 424 K/uL   MPV 8.8 0 - 99.8 fL  POCT urinalysis dipstick  Result Value Ref Range   Color, UA yellow yellow   Clarity, UA clear clear   Glucose, UA negative negative   Bilirubin, UA negative negative   Ketones, POC UA negative negative   Spec Grav, UA 1.020    Blood, UA negative negative   pH, UA 7.0    Protein Ur, POC negative negative   Urobilinogen, UA 0.2    Nitrite, UA Negative Negative   Leukocytes, UA Negative Negative  POCT urine pregnancy  Result Value Ref Range   Preg Test, Ur Negative Negative  POCT Microscopic Urinalysis ([facility])  Result Value Ref Range   WBC,UR,HPF,POC None None WBC/hpf   RBC,UR,HPF,POC None None RBC/hpf   Bacteria None None, Too numerous to count   Mucus Absent Absent   Epithelial Cells, UR Per Microscopy None None, Too numerous to count cells/hpf     EKG/XRAY:   Primary read interpreted by Dr. Conley Rolls at [facility].   ASSESSMENT/PLAN: Encounter Diagnoses  Name Primary?  . Amenorrhea   . Enlarged thyroid   . Skin flushed   . Elevated blood pressure reading without diagnosis of hypertension Yes   Labs pending Advise to emasure BP and pulse at  Home, get cuff Her BP has been stable in office BP Readings from Last 3 Encounters:  03/18/15 122/76  01/07/15 122/78  12/12/13 110/70   Fu prn   Gross sideeffects, risk and benefits, and alternatives of medications d/w patient. Patient is aware that all medications have potential sideeffects and we are unable to predict every sideeffect or drug-drug interaction that may occur.  Ruairi Stutsman DO  03/18/2015 3:56 PM

## 2015-03-18 NOTE — Patient Instructions (Signed)
During a hot flash, you may experience:  A sudden feeling of warmth spreading through your upper body and face.  A flushed appearance with red, blotchy skin.  Rapid heartbeat.  Perspiration, mostly on your upper body.  Feeling chilled as the hot flash subsides.

## 2015-08-02 ENCOUNTER — Encounter: Payer: Self-pay | Admitting: Family Medicine

## 2016-01-13 ENCOUNTER — Ambulatory Visit (INDEPENDENT_AMBULATORY_CARE_PROVIDER_SITE_OTHER): Payer: BLUE CROSS/BLUE SHIELD | Admitting: Nurse Practitioner

## 2016-01-13 ENCOUNTER — Other Ambulatory Visit: Payer: Self-pay | Admitting: Nurse Practitioner

## 2016-01-13 ENCOUNTER — Encounter: Payer: Self-pay | Admitting: Nurse Practitioner

## 2016-01-13 VITALS — BP 132/90 | HR 60 | Ht <= 58 in | Wt 91.0 lb

## 2016-01-13 DIAGNOSIS — Z01419 Encounter for gynecological examination (general) (routine) without abnormal findings: Secondary | ICD-10-CM

## 2016-01-13 DIAGNOSIS — Z1231 Encounter for screening mammogram for malignant neoplasm of breast: Secondary | ICD-10-CM

## 2016-01-13 DIAGNOSIS — Z0000 Encounter for general adult medical examination without abnormal findings: Secondary | ICD-10-CM | POA: Diagnosis not present

## 2016-01-13 LAB — CBC WITH DIFFERENTIAL/PLATELET
BASOS PCT: 1 %
Basophils Absolute: 44 cells/uL (ref 0–200)
EOS ABS: 264 {cells}/uL (ref 15–500)
EOS PCT: 6 %
HCT: 45.9 % — ABNORMAL HIGH (ref 35.0–45.0)
Hemoglobin: 15 g/dL (ref 11.7–15.5)
LYMPHS PCT: 42 %
Lymphs Abs: 1848 cells/uL (ref 850–3900)
MCH: 30.3 pg (ref 27.0–33.0)
MCHC: 32.7 g/dL (ref 32.0–36.0)
MCV: 92.7 fL (ref 80.0–100.0)
MONOS PCT: 9 %
MPV: 11.9 fL (ref 7.5–12.5)
Monocytes Absolute: 396 cells/uL (ref 200–950)
NEUTROS ABS: 1848 {cells}/uL (ref 1500–7800)
Neutrophils Relative %: 42 %
PLATELETS: 243 10*3/uL (ref 140–400)
RBC: 4.95 MIL/uL (ref 3.80–5.10)
RDW: 13.7 % (ref 11.0–15.0)
WBC: 4.4 10*3/uL (ref 3.8–10.8)

## 2016-01-13 LAB — POCT URINALYSIS DIPSTICK
BILIRUBIN UA: NEGATIVE
Blood, UA: NEGATIVE
Glucose, UA: NEGATIVE
KETONES UA: NEGATIVE
LEUKOCYTES UA: NEGATIVE
NITRITE UA: NEGATIVE
PH UA: 6
Protein, UA: NEGATIVE
UROBILINOGEN UA: NEGATIVE

## 2016-01-13 LAB — COMPREHENSIVE METABOLIC PANEL
ALBUMIN: 4.7 g/dL (ref 3.6–5.1)
ALT: 14 U/L (ref 6–29)
AST: 22 U/L (ref 10–35)
Alkaline Phosphatase: 69 U/L (ref 33–130)
BILIRUBIN TOTAL: 0.9 mg/dL (ref 0.2–1.2)
BUN: 14 mg/dL (ref 7–25)
CALCIUM: 9.4 mg/dL (ref 8.6–10.4)
CO2: 28 mmol/L (ref 20–31)
Chloride: 107 mmol/L (ref 98–110)
Creat: 0.59 mg/dL (ref 0.50–1.05)
Glucose, Bld: 96 mg/dL (ref 65–99)
Potassium: 5 mmol/L (ref 3.5–5.3)
Sodium: 143 mmol/L (ref 135–146)
TOTAL PROTEIN: 7.1 g/dL (ref 6.1–8.1)

## 2016-01-13 LAB — LIPID PANEL
CHOLESTEROL: 203 mg/dL — AB (ref 125–200)
HDL: 76 mg/dL (ref >=46)
LDL CALC: 116 mg/dL (ref <130)
TRIGLYCERIDES: 57 mg/dL (ref <150)
Total CHOL/HDL Ratio: 2.7 Ratio (ref <=5.0)
VLDL: 11 mg/dL (ref <30)

## 2016-01-13 LAB — TSH: TSH: 0.91 mIU/L

## 2016-01-13 NOTE — Patient Instructions (Signed)

## 2016-01-13 NOTE — Progress Notes (Signed)
Reviewed personally.  M. Suzanne Azzan Butler, MD.  

## 2016-01-13 NOTE — Progress Notes (Signed)
Patient ID: [name], female   DOB: May 23, 1965, 50 y.o.   MRN: [id number]  50 y.o. [id number] Married [place] Fe here for annual exam.  During the past year menses has been more irregular.  No cycle over 3 months.  When she does skip a cycle she gets bloating and breast tenderness.  Now menses at 2 days of flow and spotting for 2 days.  Denies vaso symptoms and insomnia.  No discomfort with vaginitis or dryness.  ROS, history and exam done with interpreter Dana SkinnerPhoebe [phone] via phone. Confirmed with pt and interpreter to make sure all questions and concerns were answered.  Patient's last menstrual period was 12/16/2015 (approximate).          Sexually active: Yes.    The current method of family planning is none.    Exercising: Yes.    tries to do some type of exercise Smoker:  no  Health Maintenance: Pap: 01/07/15, Negative with neg HR HPV History of abnormal Pap:  no MMG: 01/28/15, Bi-Rads 1: Negative    Colonoscopy:  Prefers not to schedule at this time BMD:   Never TDaP:  11/12/11 Labs: here today  Urine: negative, ph:6.0   reports that she has never smoked. She has never used smokeless tobacco. She reports that she does not drink alcohol or use drugs.  Past Medical History:  Diagnosis Date  . Abnormal uterine bleeding    resolved  . Enlarged thyroid     Past Surgical History:  Procedure Laterality Date  . APPENDECTOMY    . 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 Multiple Vitamin (MULTIVITAMIN) capsule Take 1 capsule by mouth daily.     No current facility-administered medications for this visit.     Family History  Problem Relation Age of Onset  . Diabetes Mother     ROS:  Pertinent items are noted in HPI.  Otherwise, a comprehensive ROS was negative.  Exam:   BP 132/90 (BP Location: Right Arm, Patient Position: Sitting, Cuff Size:  Normal)   Pulse 60   Ht 4\' 10"  (1.473 m)   Wt 91 lb (41.3 kg)   LMP 12/16/2015 (Approximate) Comment: spotting only  BMI 19.02 kg/m  Height: 4\' 10"  (147.3 cm) Ht Readings from Last 3 Encounters:  01/13/16 4\' 10"  (1.473 m)  03/18/15 4\' 9"  (1.448 m)  01/07/15 4' 9.75" (1.467 m)    General appearance: alert, cooperative and appears stated age Head: Normocephalic, without obvious abnormality, atraumatic Neck: no adenopathy, supple, symmetrical, trachea midline and thyroid normal to inspection and palpation Lungs: clear to auscultation bilaterally Breasts: normal appearance, no masses or tenderness Heart: regular rate and rhythm Abdomen: soft, non-tender; no masses,  no organomegaly Extremities: extremities normal, atraumatic, no cyanosis or edema Skin: Skin color, texture, turgor normal. No rashes or lesions Lymph nodes: Cervical, supraclavicular, and axillary nodes normal. No abnormal inguinal nodes palpated Neurologic: Grossly normal   Pelvic: External genitalia:  no lesions              Urethra:  normal appearing urethra with no masses, tenderness or lesions              Bartholin's and Skene's: normal                 Vagina: normal appearing vagina with normal color and discharge, no lesions  Cervix: anteverted              Pap taken: No. Bimanual Exam:  Uterus:  normal size, contour, position, consistency, mobility, non-tender              Adnexa: no mass, fullness, tenderness               Rectovaginal: Confirms               Anus:  normal sphincter tone, no lesions  Chaperone present: yes  A:  Well Woman with normal exam  Perimenopause with slight irregular menses             History of Vitamin D deficiency                         P:   Reviewed health and wellness pertinent to exam  Pap smear as above  Mammogram is due after 9/12 of this year  She will also call insurance and see if colonoscopy is a covered procedure  Counseled on breast self exam,  mammography screening, adequate intake of calcium and vitamin D, diet and exercise return annually or prn  An After Visit Summary was printed and given to the patient.

## 2016-01-14 LAB — VITAMIN D 25 HYDROXY (VIT D DEFICIENCY, FRACTURES): Vit D, 25-Hydroxy: 31 ng/mL (ref 30–100)

## 2016-01-29 ENCOUNTER — Ambulatory Visit
Admission: RE | Admit: 2016-01-29 | Discharge: 2016-01-29 | Disposition: A | Discharge status: Home / Self Care | Payer: BLUE CROSS/BLUE SHIELD | Source: Ambulatory Visit | Attending: Nurse Practitioner | Admitting: Nurse Practitioner

## 2016-01-29 DIAGNOSIS — Z1231 Encounter for screening mammogram for malignant neoplasm of breast: Secondary | ICD-10-CM | POA: Diagnosis not present

## 2016-11-30 ENCOUNTER — Telehealth: Payer: Self-pay | Admitting: Obstetrics and Gynecology

## 2016-11-30 NOTE — Telephone Encounter (Signed)
Unable to lv msg regarding upcoming appointment has been canceled and needs to be rescheduled.Letter mailed.

## 2017-01-13 ENCOUNTER — Ambulatory Visit: Payer: BLUE CROSS/BLUE SHIELD | Admitting: Nurse Practitioner

## 2017-02-02 ENCOUNTER — Encounter: Payer: Self-pay | Admitting: Certified Nurse Midwife

## 2017-02-02 ENCOUNTER — Other Ambulatory Visit: Payer: Self-pay | Admitting: Certified Nurse Midwife

## 2017-02-02 ENCOUNTER — Ambulatory Visit (INDEPENDENT_AMBULATORY_CARE_PROVIDER_SITE_OTHER): Payer: BLUE CROSS/BLUE SHIELD | Admitting: Certified Nurse Midwife

## 2017-02-02 VITALS — BP 110/70 | HR 68 | Resp 16 | Ht <= 58 in | Wt 91.0 lb

## 2017-02-02 DIAGNOSIS — Z1211 Encounter for screening for malignant neoplasm of colon: Secondary | ICD-10-CM | POA: Diagnosis not present

## 2017-02-02 DIAGNOSIS — N912 Amenorrhea, unspecified: Secondary | ICD-10-CM | POA: Diagnosis not present

## 2017-02-02 DIAGNOSIS — N951 Menopausal and female climacteric states: Secondary | ICD-10-CM

## 2017-02-02 DIAGNOSIS — Z01419 Encounter for gynecological examination (general) (routine) without abnormal findings: Secondary | ICD-10-CM | POA: Diagnosis not present

## 2017-02-02 DIAGNOSIS — Z1231 Encounter for screening mammogram for malignant neoplasm of breast: Secondary | ICD-10-CM

## 2017-02-02 LAB — POCT URINE PREGNANCY: Preg Test, Ur: NEGATIVE

## 2017-02-02 NOTE — Progress Notes (Signed)
51 y.o. [id number] Married  [place] Fe here for annual exam. No period since ? 05/18/16. Denies vaginal dryness, occasional vaginal itching. Denies hot flashes or night sweats.(all conversation with intrepreter line). Patient was seen by ?PCP and was told she had elevated BP, no medication, taking BP at home, staying normal. Has noted some sleep interruption with ? Night sweats. Works second shift and now is vegetarian. Patient uses calendar to predict ovulation for contraception. Patient has labs with job to assess other health status. Agreeable to labs needed to evaluate amenorrhea today. Denies any pelvic pain or vaginal bleeding. No other health issues today  Patient's last menstrual period was 05/18/2016.          Sexually active: Yes.    The current method of family planning is none.    Exercising: Yes.    light exercise Smoker:  no  Health Maintenance: Pap:  01-07-15 neg HPV HR neg History of Abnormal Pap: no MMG:  01-29-16 category b density birads 1:neg Self Breast exams: no Colonoscopy:  none BMD:   none TDaP:  2013 Shingles: no Pneumonia: no Hep C and HIV: not done Labs: poct upt-neg   reports that she has never smoked. She has never used smokeless tobacco. She reports that she does not drink alcohol or use drugs.  Past Medical History:  Diagnosis Date  . Abnormal uterine bleeding    resolved  . Enlarged thyroid     Past Surgical History:  Procedure Laterality Date  . APPENDECTOMY    . 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 Omega-3 Fatty Acids (FISH OIL PO) Take by mouth.     No current facility-administered medications for this visit.     Family History  Problem Relation Age of Onset  . Diabetes Mother     ROS:  Pertinent items are noted in HPI.  Otherwise, a comprehensive ROS was negative.  Exam:   BP 110/70   Pulse 68   Resp  16   Ht 4' 9.75" (1.467 m)   Wt 91 lb (41.3 kg)   LMP 05/18/2016   BMI 19.18 kg/m  Height: 4' 9.75" (146.7 cm) Ht Readings from Last 3 Encounters:  02/02/17 4' 9.75" (1.467 m)  01/13/16  (1.473 m)  03/18/15  (1.448 m)    General appearance: alert, cooperative and appears stated age Head: Normocephalic, without obvious abnormality, atraumatic Neck: no adenopathy, supple, symmetrical, trachea midline and thyroid normal to inspection and palpation Lungs: clear to auscultation bilaterally Breasts: normal appearance, no masses or tenderness, No nipple retraction or dimpling, No nipple discharge or bleeding, No axillary or supraclavicular adenopathy Heart: regular rate and rhythm Abdomen: soft, non-tender; no masses,  no organomegaly Extremities: extremities normal, atraumatic, no cyanosis or edema Skin: Skin color, texture, turgor normal. No rashes or lesions Lymph nodes: Cervical, supraclavicular, and axillary nodes normal. No abnormal inguinal nodes palpated Neurologic: Grossly normal   Pelvic: External genitalia:  no lesions              Urethra:  normal appearing urethra with no masses, tenderness or lesions              Bartholin's and Skene's: normal                 Vagina: normal appearing vagina with normal color and discharge, no lesions  Cervix: no cervical motion tenderness, no lesions and normal appearance              Pap taken: No. Bimanual Exam:  Uterus:  normal size, contour, position, consistency, mobility, non-tender              Adnexa: normal adnexa and no mass, fullness, tenderness               Rectovaginal: Confirms               Anus:  normal sphincter tone, no lesions  Chaperone present: yes  A:  Well Woman with normal exam  Menopausal symptoms with amenorrhea  Wellness labs with employment  Colonoscopy due,mammogram due, patient would like to schedule  P:   Reviewed health and wellness pertinent to exam  Discussed importance of  evaluation to determine if she needs to have Provera for withdrawal bleeding. Patient agreeable. Given booklet on menopause for her daughter to read to her(speaks and reads [place]).  Labs: FSH, Prolactin TSH, AMH  Discussed importance of colonoscopy, risks/benefits, would like to schedule. Patient will be scheduled referred for colonoscopy and mammogram scheduled prior to leaving today. Questions addressed.  Pap smear: no   counseled on breast self exam, menopause, adequate intake of calcium and vitamin D, diet and exercise  return annually or prn  An After Visit Summary was printed and given to the patient.

## 2017-02-02 NOTE — Addendum Note (Signed)
Addended by: Verner Chol on: 02/02/2017 05:26 PM   Modules accepted: Orders

## 2017-02-02 NOTE — Progress Notes (Signed)
Scheduled patient while in office for [facility] on 02/23/2017 at 11:30 am. Patient is agreeable to date and time.

## 2017-02-02 NOTE — Patient Instructions (Addendum)
EXERCISE AND DIET:  We recommended that you start or continue a regular exercise program for good health. Regular exercise means any activity that makes your heart beat faster and makes you sweat.  We recommend exercising at least 30 minutes per day at least 3 days a week, preferably 4 or 5.  We also recommend a diet low in fat and sugar.  Inactivity, poor dietary choices and obesity can cause diabetes, heart attack, stroke, and kidney damage, among others.    ALCOHOL AND SMOKING:  Women should limit their alcohol intake to no more than 7 drinks/beers/glasses of wine (combined, not each!) per week. Moderation of alcohol intake to this level decreases your risk of breast cancer and liver damage. And of course, no recreational drugs are part of a healthy lifestyle.  And absolutely no smoking or even second hand smoke. Most people know smoking can cause heart and lung diseases, but did you know it also contributes to weakening of your bones? Aging of your skin?  Yellowing of your teeth and nails?  CALCIUM AND VITAMIN D:  Adequate intake of calcium and Vitamin D are recommended.  The recommendations for exact amounts of these supplements seem to change often, but generally speaking 600 mg of calcium (either carbonate or citrate) and 800 units of Vitamin D per day seems prudent. Certain women may benefit from higher intake of Vitamin D.  If you are among these women, your doctor will have told you during your visit.    PAP SMEARS:  Pap smears, to check for cervical cancer or precancers,  have traditionally been done yearly, although recent scientific advances have shown that most women can have pap smears less often.  However, every woman still should have a physical exam from her gynecologist every year. It will include a breast check, inspection of the vulva and vagina to check for abnormal growths or skin changes, a visual exam of the cervix, and then an exam to evaluate the size and shape of the uterus and  ovaries.  And after 51 years of age, a rectal exam is indicated to check for rectal cancers. We will also provide age appropriate advice regarding health maintenance, like when you should have certain vaccines, screening for sexually transmitted diseases, bone density testing, colonoscopy, mammograms, etc.   MAMMOGRAMS:  All women over 40 years old should have a yearly mammogram. Many facilities now offer a "3D" mammogram, which may cost around $50 extra out of pocket. If possible,  we recommend you accept the option to have the 3D mammogram performed.  It both reduces the number of women who will be called back for extra views which then turn out to be normal, and it is better than the routine mammogram at detecting truly abnormal areas.    COLONOSCOPY:  Colonoscopy to screen for colon cancer is recommended for all women at age 50.  We know, you hate the idea of the prep.  We agree, BUT, having colon cancer and not knowing it is worse!!  Colon cancer so often starts as a polyp that can be seen and removed at colonscopy, which can quite literally save your life!  And if your first colonoscopy is normal and you have no family history of colon cancer, most women don't have to have it again for 10 years.  Once every ten years, you can do something that may end up saving your life, right?  We will be happy to help you get it scheduled when you are ready.    Be sure to check your insurance coverage so you understand how much it will cost.  It may be covered as a preventative service at no cost, but you should check your particular policy.     Perimenopause Perimenopause is the time when your body begins to move into the menopause (no menstrual period for 12 straight months). It is a natural process. Perimenopause can begin 2-8 years before the menopause and usually lasts for 1 year after the menopause. During this time, your ovaries may or may not produce an egg. The ovaries vary in their production of estrogen and  progesterone hormones each month. This can cause irregular menstrual periods, difficulty getting pregnant, vaginal bleeding between periods, and uncomfortable symptoms. What are the causes?  Irregular production of the ovarian hormones, estrogen and progesterone, and not ovulating every month. Other causes include:  Tumor of the pituitary gland in the brain.  Medical disease that affects the ovaries.  Radiation treatment.  Chemotherapy.  Unknown causes.  Heavy smoking and excessive alcohol intake can bring on perimenopause sooner.  What are the signs or symptoms?  Hot flashes.  Night sweats.  Irregular menstrual periods.  Decreased sex drive.  Vaginal dryness.  Headaches.  Mood swings.  Depression.  Memory problems.  Irritability.  Tiredness.  Weight gain.  Trouble getting pregnant.  The beginning of losing bone cells (osteoporosis).  The beginning of hardening of the arteries (atherosclerosis). How is this diagnosed? Your health care provider will make a diagnosis by analyzing your age, menstrual history, and symptoms. He or she will do a physical exam and note any changes in your body, especially your female organs. Female hormone tests may or may not be helpful depending on the amount of female hormones you produce and when you produce them. However, other hormone tests may be helpful to rule out other problems. How is this treated? In some cases, no treatment is needed. The decision on whether treatment is necessary during the perimenopause should be made by you and your health care provider based on how the symptoms are affecting you and your lifestyle. Various treatments are available, such as:  Treating individual symptoms with a specific medicine for that symptom.  Herbal medicines that can help specific symptoms.  Counseling.  Group therapy.  Follow these instructions at home:  Keep track of your menstrual periods (when they occur, how heavy  they are, how long between periods, and how long they last) as well as your symptoms and when they started.  Only take over-the-counter or prescription medicines as directed by your health care provider.  Sleep and rest.  Exercise.  Eat a diet that contains calcium (good for your bones) and soy (acts like the estrogen hormone).  Do not smoke.  Avoid alcoholic beverages.  Take vitamin supplements as recommended by your health care provider. Taking vitamin E may help in certain cases.  Take calcium and vitamin D supplements to help prevent bone loss.  Group therapy is sometimes helpful.  Acupuncture may help in some cases. Contact a health care provider if:  You have questions about any symptoms you are having.  You need a referral to a specialist (gynecologist, psychiatrist, or psychologist). Get help right away if:  You have vaginal bleeding.  Your period lasts longer than 8 days.  Your periods are recurring sooner than 21 days.  You have bleeding after intercourse.  You have severe depression.  You have pain when you urinate.  You have severe headaches.  You have vision   problems. This information is not intended to replace advice given to you by your health care provider. Make sure you discuss any questions you have with your health care provider. Document Released: 06/11/2004 Document Revised: 10/10/2015 Document Reviewed: 12/01/2012 Elsevier Interactive Patient Education  2017 Elsevier Inc.  

## 2017-02-07 LAB — ANTI MULLERIAN HORMONE

## 2017-02-07 LAB — FOLLICLE STIMULATING HORMONE: FSH: 57.1 m[IU]/mL

## 2017-02-07 LAB — PROLACTIN: Prolactin: 8.3 ng/mL (ref 4.8–23.3)

## 2017-02-07 LAB — TSH: TSH: 1.28 u[IU]/mL (ref 0.450–4.500)

## 2017-02-10 ENCOUNTER — Telehealth: Payer: Self-pay | Admitting: *Deleted

## 2017-02-10 MED ORDER — MEDROXYPROGESTERONE ACETATE 10 MG PO TABS: 10.0000 mg | ORAL_TABLET | Freq: Every day | ORAL | 0 refills | Status: DC

## 2017-02-10 NOTE — Telephone Encounter (Signed)
OK per dpr to speak with daughter "Selena Batten".    Notes recorded by Leda Min, RN on 02/10/2017 at 1:29 PM EDT Left message to call Noreene Larsson at [phone]. See telephone encounter dated 02/10/17. ------  Notes recorded by Verner Chol, CNM on 02/08/2017 at 6:13 AM EDT Notify patient that [facility] is menopausal range and may or may not have further bleeding Her TSH, prolactin are normal AMH shows infertility. She had negative UPT on visit and was told no sexual activity and with above levels, she needs rx Provera 10 mg for 10 days she may or may not have bleeding up to two weeks after last dose. She needs to call and advise bleeding or no bleeding after completion. No order for Provera placed

## 2017-02-10 NOTE — Telephone Encounter (Signed)
Spoke with patients daughter "Selena Batten", ok per current dpr. Advised as seen below per Leota Sauers, CNM. Advised daughter to confirm abstinence since last OV prior to starting Provera, was discussed at OV. Rx for provera to verified pharmacy on file. Daughter verbalizes understanding and is agreeable.  Routing to provider for final review. Patient is agreeable to disposition. Will close encounter.

## 2017-02-12 ENCOUNTER — Telehealth: Payer: Self-pay | Admitting: Certified Nurse Midwife

## 2017-02-12 NOTE — Telephone Encounter (Signed)
Left voicemail regarding referral appointment. The information is listed below. Should the patient need to cancel or reschedule this appointment, Please advise them to call the office they've been referred to in order to reschedule. Patient will need an interpreter.  [facility] [address] [place], [place]  Phone: [phone]  Dr. Loreta Ave 02/23/17 @ 1:30 pm. Please arrive 15 minutes early and bring your insurance card and photo id and list of medications.

## 2017-02-23 ENCOUNTER — Ambulatory Visit
Admission: RE | Admit: 2017-02-23 | Discharge: 2017-02-23 | Disposition: A | Discharge status: Home / Self Care | Payer: BLUE CROSS/BLUE SHIELD | Source: Ambulatory Visit | Attending: Certified Nurse Midwife | Admitting: Certified Nurse Midwife

## 2017-02-23 DIAGNOSIS — Z1231 Encounter for screening mammogram for malignant neoplasm of breast: Secondary | ICD-10-CM

## 2017-03-02 DIAGNOSIS — Z1211 Encounter for screening for malignant neoplasm of colon: Secondary | ICD-10-CM | POA: Diagnosis not present

## 2017-03-02 DIAGNOSIS — K5901 Slow transit constipation: Secondary | ICD-10-CM | POA: Diagnosis not present

## 2017-03-03 NOTE — Telephone Encounter (Signed)
Agree with plan 

## 2017-03-03 NOTE — Telephone Encounter (Signed)
Spoke with patients daughter "Selena BattenKim", ok per current dpr. Calling to provide update for provera 10mg  x 10days.   Completed provera on 10/6, reports no bleeding to date. Advised daughter 10/20 would be 2 weeks after completion, continue to monitor and return call on Monday 10/22 with update.  Advised daughter will review with Leota Sauerseborah Leonard, CNM and return call with any additional recommendations, daughter agreeable.  Leota Sauerseborah Leonard, CNM -any additional recommendations?

## 2017-03-03 NOTE — Telephone Encounter (Signed)
Patient's daughter Selena BattenKim is calling to give results after taking medication. DPR on file to talk with daughter.

## 2017-03-24 DIAGNOSIS — Z01810 Encounter for preprocedural cardiovascular examination: Secondary | ICD-10-CM | POA: Diagnosis not present

## 2017-03-24 DIAGNOSIS — I38 Endocarditis, valve unspecified: Secondary | ICD-10-CM | POA: Diagnosis not present

## 2017-04-14 DIAGNOSIS — Z1211 Encounter for screening for malignant neoplasm of colon: Secondary | ICD-10-CM | POA: Diagnosis not present

## 2017-09-24 ENCOUNTER — Encounter: Payer: Self-pay | Admitting: Physician Assistant

## 2017-09-24 ENCOUNTER — Other Ambulatory Visit: Payer: Self-pay

## 2017-09-24 ENCOUNTER — Ambulatory Visit: Payer: BLUE CROSS/BLUE SHIELD | Admitting: Physician Assistant

## 2017-09-24 VITALS — BP 120/66 | HR 76 | Temp 98.1°F | Resp 16 | Ht <= 58 in | Wt 86.8 lb

## 2017-09-24 DIAGNOSIS — L247 Irritant contact dermatitis due to plants, except food: Secondary | ICD-10-CM

## 2017-09-24 MED ORDER — BETAMETHASONE DIPROPIONATE 0.05 % EX CREA: TOPICAL_CREAM | Freq: Two times a day (BID) | CUTANEOUS | 0 refills | Status: DC

## 2017-09-24 MED ORDER — METHYLPREDNISOLONE ACETATE 80 MG/ML IJ SUSP
40.0000 mg | Freq: Once | INTRAMUSCULAR | Status: AC
  Administered 2017-09-24: 40 mg via INTRAMUSCULAR

## 2017-09-24 NOTE — Progress Notes (Signed)
Patient ID: [name], female     DOB: 07-30-1965, 52 y.o.    MRN: [id number]  PCP: Patient, No Pcp Per  Chief Complaint  Patient presents with  . Poison Ivy    x 1 week     Subjective:   This patient is new to me and presents for evaluation of itchy rash. She is accompanied by her daughter, who translates.  Working in the garden. Every year. LEFT axilla, anterior neck, upper back, legs. Betamethasone dipropionate Cream 0.05% expired. Also desires the injection she received last time this happened.  Chart reviewed. Last visit here was 02/2015, for unrelated issue. Last visit for THIS problem was 02/2013. At that visit, she was prescribed the cream, and received 40 mg Depo-Medrol IM.  Review of Systems No SOB, CP. No mouth, lip, eye symptoms.  Prior to Admission medications   Medication Sig Start Date End Date Taking? Authorizing Provider  medroxyPROGESTERone (PROVERA) 10 MG tablet Take 1 tablet (10 mg total) by mouth daily. 02/10/17  Yes Verner Chol, CNM  Omega-3 Fatty Acids (FISH OIL PO) Take by mouth.   Yes [provider]     No Known Allergies   Patient Active Problem List   Diagnosis Date Noted  . Enlarged thyroid 12/12/2013    Class: History of     Family History  Problem Relation Age of Onset  . Diabetes Mother      Social History   Socioeconomic History  . Marital status: Married    Spouse name: Tumg  . Number of children: 3  . Years of education: Not on file  . Highest education level: 10th grade  Occupational History  . Occupation: Scientific laboratory technician: [employer]    Comment: Environmental manager  Social Needs  . Financial resource strain: Not on file  . Food insecurity:    Worry: Not on file    Inability: Not on file  . Transportation needs:    Medical: Not on file    Non-medical: Not on file  Tobacco Use  . Smoking status: Never Smoker  . Smokeless tobacco: Never Used  Substance and Sexual  Activity  . Alcohol use: No    Alcohol/week: 0.0 oz  . Drug use: No  . Sexual activity: Yes    Partners: Male  Lifestyle  . Physical activity:    Days per week: Not on file    Minutes per session: Not on file  . Stress: Not on file  Relationships  . Social connections:    Talks on phone: Not on file    Gets together: Not on file    Attends religious service: Not on file    Active member of club or organization: Not on file    Attends meetings of clubs or organizations: Not on file    Relationship status: Not on file  . Intimate partner violence:    Fear of current or ex partner: Not on file    Emotionally abused: Not on file    Physically abused: Not on file    Forced sexual activity: Not on file  Other Topics Concern  . Not on file  Social History Narrative   From [place]. Came to the [place] at age 40.   Lives with her husband and 3 daughters.         Objective:  Physical Exam  Constitutional: She is oriented to person, place, and time. She appears well-developed and well-nourished. She  is active and cooperative. No distress.  BP 120/66   Pulse 76   Temp 98.1 F (36.7 C)   Resp 16   Ht 4' 9.75" (1.467 m)   Wt 86 lb 12.8 oz (39.4 kg)   SpO2 99%   BMI 18.30 kg/m    Eyes: Conjunctivae are normal.  Pulmonary/Chest: Effort normal.  Neurological: She is alert and oriented to person, place, and time.  Skin: Skin is warm and dry. Capillary refill takes less than 2 seconds. Rash noted. Rash is papular and vesicular.  Scattered papular/vesicular lesions, many in linear distribution, on the legs, upper back/lower neck, anterior neck, forearms. Erythematous patch in the LEFT axilla.  Psychiatric: She has a normal mood and affect. Her speech is normal and behavior is normal.       Assessment & Plan:  1. Contact dermatitis and eczema due to plant Supportive care.  Anticipatory guidance.  RTC if symptoms worsen/persist. - methylPREDNISolone acetate (DEPO-MEDROL) injection 40  mg - betamethasone dipropionate (DIPROLENE) 0.05 % cream; Apply topically 2 (two) times daily. Do not use more than 7 consecutive days  Dispense: 45 g; Refill: 0    Return if symptoms worsen or fail to improve.   Fernande Bras, PA-C [facility]

## 2017-09-24 NOTE — Patient Instructions (Addendum)
IF you received an x-ray today, you will receive an invoice from [facility]. Please contact [facility] at [phone] with questions or concerns regarding your invoice.   IF you received labwork today, you will receive an invoice from [place]. Please contact [facility] at [phone] with questions or concerns regarding your invoice.   Our billing staff will not be able to assist you with questions regarding bills from these companies.  You will be contacted with the lab results as soon as they are available. The fastest way to get your results is to activate your My Chart account. Instructions are located on the last page of this paperwork. If you have not heard from [place] regarding the results in 2 weeks, please contact this office.      Vim da do cy s?n ??c (Poison Ivy Dermatitis)  Vim da do cy s?n ??c l vim da do cc ch?t d? ?ng nguyn trn l c?a cy s?n ??c (poinson ivy) gy ra. Ph?n ?ng ny c?a da th??ng l ??, s?ng n?, n?i m?n gi?p v r?t ng?a. NGUYN NHN Tnh tr?ng ny x?y ra do m?t ch?t ha h?c ??c hi?u (urushiol) c trong nh?a cy s?n ??c. Ch?t ha h?c ny dnh v c th? d? dng dy sang ng??i, ??ng v?t v cc ?? v?t. Qu v? c th? b? vim da cy s?n ??c do:  Ti?p xc tr?c ti?p v?i cy s?n ??c.  Ch?m vo ??ng v?t, nh?ng ng??i khc, ho?c ?? v?t ? ti?p xc v?i cy s?n ??c v ? b? nhi?m ch?t ha h?c ?. CC Y?U T? NGUY C? Tnh tr?ng ny hay x?y ra h?n ?:  Nh?ng ng??i th??ng ? ngoi tr?i.  Nh?ng ng??i ra ngoi m khng m?c qu?n o b?o v?, ch?ng h?n nh? giy kn, qu?n di v o di tay. TRI?U CH?NG Nh?ng tri?u ch?ng c?a tnh tr?ng ny bao g?m:  ?? v ng?a ngy.  B? pht ban m th??ng c ch? s?ng thnh c?c v m?n n??c. Pht ban th??ng xu?t hi?n 48 gi? sau khi ti?p xc.  S?ng n?. ?i?u ny c th? x?y ra n?u ph?n ?ng n?ng h?n. Cc tri?u ch?ng th??ng ko di trong 1 - 2 tu?n. Tuy nhin, ? l?n ??u tin qu v? b? tnh tr?ng ny, cc tri?u ch?ng c  th? ko di 3-4 tu?n. CH?N ?ON Tnh tr?ng ny c th? ???c ch?n ?on d?a vo cc tri?u ch?ng c?a qu v? v khm th?c th?Shaune Pascal gia ch?m [place] s?c kh?e c?ng c th? h?i qu v? v? b?t k? ho?t ??ng ngoi tr?i no g?n ?y. ?I?U TR? Vi?c ?i?u tr? tnh tr?ng ny s? khc nhau ty thu?c m?c ?? n?ng. ?i?u tr? c th? bao g?m:  Kem hydrocortisone ho?c kem calamine d?ng l?ng ?? gi?m ng?a.  T?m b?ng b?t y?n m?ch ?? lm d?u da.  Thu?c vin khng histamine khng c?n k ??n.  Thu?c u?ng steroid ??i v?i cc tr??ng h?p n?ng h?n. H??NG D?N CH?M [place] T?I NH  Ch? dng ho?c bi thu?c khng c?n k ??n v thu?c c?n k ??n theo ch? d?n c?a chuyn gia ch?m  s?c kh?e.  R?a ch? da b? ti?p xc cng s?m cng t?t b?ng x phng v n??c l?nh.  S? d?ng kem hydrocortisone ho?c kem calamine d?ng l?ng khi c?n thi?t ?? lm d?u da v gi?m ng?a.  T?m b?ng b?t y?n m?ch n?u c?n thi?t. S? d?ng keo b?t y?n m?ch. Qu v? c th? mua t?i  hi?u thu?c ho?c c?a hng t?p ha ? ??a ph??ng. Lm theo nh?ng ch? d?n trn bao b.  Khng gi ho?c ch xt vo da qu v?.  Trong khi qu v? b? pht ban, hy gi?t qu?n o ngay sau khi m?c. PHNG NG?A  H?c cch nh?n bi?t cy s?n ??c v trnh ti?p xc v?i cy ny. Cy ny c th? ???c nh?n bi?t b?ng s? l??ng l. Thng th??ng, cy s?n ??c c ba l v?i cc cnh th?p trn m?t g?c duy nh?t. L cy th??ng bng long v chng c cc mp r?ng c?a d?n ??n ? m?t ?i?m pha tr??c.  N?u qu v? ti?p xc ph?i cy s?n ??c, hy r?a tay k? b?ng x phng v n??c ngay l?p t?c. Qu v? c kho?ng 30 pht ?? r?a s?ch nh?a cy tr??c khi n gy pht ban. Hy ch?c ch?n r?a d??i k? mng tay v b?t k? ph?n nh?a cy no cn c?ng s? ti?p t?c gy pht ban.  Khi ?i d ngo?i ho?c c?m tr?i, hy m?c qu?n o ?? gip trnh ti?p xc v?i da qu v?. Qu?n o ph h?p bao g?m qu?n di, o di tay, t?t cao v giy d ngo?i. Qu v? c?ng c th? bi d?u phng trnh cho da qu v? ?? h?n ch? b? ti?p xc.  N?u qu v? nghi ng? qu?n o ho?c d?ng c?  ngoi tr?i ti?p xc v?i cy s?n ??c, hy r?a s?ch cc ?? ? ? ngoi nh b?ng vi t??i cy tr??c khi ?em vo nh. ?I KHM N?U:  Qu v? c v?t lot h mi?ng ? ch? pht ban.  Qu v? b? ??, s?ng n? ho?c ?au ? vng b? ?nh h??ng.  Qu v? b? ?? lan ra ngoi vng b? pht ban.  Qu v? c ch?t d?ch, mu ho?c m? ch?y ? vng b? ?nh h??ng.  Qu v? b? s?t.  Qu v? b? pht ban ? ph?m vi r?ng trn c? th?.  Qu v? b? pht ban ? m?t, mi?ng, ho?c b? ph?n sinh d?c.  Ch? pht ban khng ?? sau vi ngy. NGAY L?P T?C ?I KHM N?U:  M?t qu v? b? s?ng ho?c m?t qu v? s?ng hp.  Qu v? b? kh th?.  Qu v? b? kh nu?t. Thng tin ny khng nh?m m?c ?ch thay th? cho l?i khuyn m chuyn gia ch?m [place] s?c kh?e ni v?i qu v?. Hy b?o ??m qu v? ph?i th?o lu?n b?t k? v?n ?? g m qu v? c v?i chuyn gia ch?m [place] s?c kh?e c?a qu v?. Document Released: 05/04/2005 Document Revised: 08/26/2015 Document Reviewed: 10/10/2014 Elsevier Interactive Patient Education  2018 [employer].

## 2018-01-06 ENCOUNTER — Telehealth: Payer: Self-pay | Admitting: Certified Nurse Midwife

## 2018-01-06 NOTE — Telephone Encounter (Signed)
Patient's daughter, [name], returned call. States patient would like to proceed with scheduling an appointment tomorrow. Patient scheduled for 01-07-18 at 1100. Patient agreeable to date and time of appointment. Aware if symptoms worsen before appointment to go to Urgent Care. Patient agreeable.   Routing to provider for final review. Patient agreeable to disposition. Will close encounter.

## 2018-01-06 NOTE — Telephone Encounter (Signed)
Call to patient. Spoke with patient's daughter, Greggory StallionKimxanh, okay per DPR who translates for patient. [name] states patient feels like she has to "go often" and just feels uncomfortable. Denies fever or back pain. OV recommended and patient's daughter states she declines and does not want to be seen until annual appointment on 02-08-18. RN advised patient's daughter that if patient's symptoms worsen or develops fever or back pain, patient would need to be seen for evaluation. Patient agreeable.   Routing to provider for final review. Patient agreeable to disposition. Will close encounter.

## 2018-01-06 NOTE — Telephone Encounter (Signed)
Patient called to reschedule her annual exam to an earlier date. Last seen 02/02/17 for annual. States she believes she has a UTI. I advised her that I could schedule an office visit for her, but patient refused stating she did not want to pay another copay for an office visit and that she would call back to schedule if needed. FYI.

## 2018-01-07 ENCOUNTER — Encounter: Payer: Self-pay | Admitting: Certified Nurse Midwife

## 2018-01-07 ENCOUNTER — Ambulatory Visit: Payer: BLUE CROSS/BLUE SHIELD | Admitting: Certified Nurse Midwife

## 2018-01-07 ENCOUNTER — Other Ambulatory Visit: Payer: Self-pay

## 2018-01-07 VITALS — BP 100/60 | HR 64 | Temp 98.3°F | Resp 16 | Ht <= 58 in | Wt 88.0 lb

## 2018-01-07 DIAGNOSIS — R350 Frequency of micturition: Secondary | ICD-10-CM

## 2018-01-07 DIAGNOSIS — N390 Urinary tract infection, site not specified: Secondary | ICD-10-CM | POA: Diagnosis not present

## 2018-01-07 DIAGNOSIS — B373 Candidiasis of vulva and vagina: Secondary | ICD-10-CM

## 2018-01-07 DIAGNOSIS — B3731 Acute candidiasis of vulva and vagina: Secondary | ICD-10-CM

## 2018-01-07 LAB — POCT URINALYSIS DIPSTICK
BILIRUBIN UA: NEGATIVE
Blood, UA: NEGATIVE
Glucose, UA: NEGATIVE
Ketones, UA: NEGATIVE
LEUKOCYTES UA: NEGATIVE
Nitrite, UA: NEGATIVE
PROTEIN UA: NEGATIVE
Urobilinogen, UA: NEGATIVE E.U./dL — AB
pH, UA: 5 (ref 5.0–8.0)

## 2018-01-07 MED ORDER — FLUCONAZOLE 150 MG PO TABS: ORAL_TABLET | ORAL | 0 refills | Status: DC

## 2018-01-07 NOTE — Progress Notes (Signed)
52 y.o. 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 Patient used new vaginal wash to clean with and had symptoms.She has increased her water and used celery and felt the symptoms are better, but not resolved. . Patient complaining of urinary frequency no urgency now. Patient denies fever, chills, nausea or back pain. No other new personal products. Patient feels not related to sexual activity. She also has noted some  vaginal itching with discharge for 2 days. No other health issues today.    Contraception is menopause. She does have vaginal dryness sometimes.   Review of Systems  Constitutional: Negative.   HENT: Negative.   Eyes: Negative.   Respiratory: Negative.   Cardiovascular: Negative.   Gastrointestinal: Negative.   Genitourinary:       Pain with urination, discharge & abdominal discomfort  Musculoskeletal: Negative.   Skin: Negative.   Neurological: Negative.   Endo/Heme/Allergies: Negative.   Psychiatric/Behavioral: Negative.     O: Healthy female WDWN Affect: Normal, orientation x 3 Skin : warm and dry CVAT: negative bilateral Abdomen: negative for suprapubic tenderness  Pelvic exam: External genital area: normal, no lesions, slight increase pink, no scaling or exudate Bladder,Urethra non tender Urethral meatus: non tender Vagina: white slight musty odor  vaginal discharge, normal appearing vagina  Wet prep taken, Affirm taken Cervix: normal, non tender Uterus:normal,non tender Adnexa: normal non tender, no fullness or masses   A: Normal pelvic exam Yeast vaginitis R/O UTI poct urine-neg  P: Reviewed findings of yeast vaginitis and need for treatment. Expectations with treatment given. ZO:[id number]:Diflucan with written instructions given UJW:JXBJYLab:Urine  Culture Affirm Reviewed warning signs and symptoms of UTI if symptoms return and need to advise if occurring. Encouraged to limit  soda, tea, and coffee and be sure to increase water intake. Patient will have daughter call if questions or problems.   RV prn

## 2018-01-07 NOTE — Progress Notes (Deleted)
52 y.o. Married vietnamese female [id number] here with complaint of UTI, with onset  on ***. Patient complaining of urinary frequency/urgency/ and pain with urination. Patient denies fever, chills, nausea or back pain. No new personal products. Patient feels *** to sexual activity. Denies any vaginal symptoms.    Contraception is ***. Menopausal with vaginal dryness. Patient *** adequate water intake.  Review of Systems  Constitutional: Negative.   HENT: Negative.   Eyes: Negative.   Respiratory: Negative.   Cardiovascular: Negative.   Gastrointestinal: Negative.   Genitourinary:       Pain with urination, discharge & abdominal discomfort  Musculoskeletal: Negative.   Skin: Negative.   Neurological: Negative.   Endo/Heme/Allergies: Negative.   Psychiatric/Behavioral: Negative.     O: Healthy female WDWN Affect: Normal, orientation x 3 Skin : warm and dry CVAT: *** bilateral Abdomen: *** for suprapubic tenderness  Pelvic exam: External genital area: normal, no lesions Bladder,Urethra tender, Urethral meatus: tender, red Vagina: normal vaginal discharge, normal appearance  Wet prep *** Cervix: normal, non tender Uterus:normal,non tender Adnexa: normal non tender, no fullness or masses   A: UTI Normal pelvic exam poct urine-neg  P: Reviewed findings of UTI and need for treatment. Rx: ZOX:WRUEALab:Urine micro, culture Reviewed warning signs and symptoms of UTI and need to advise if occurring. Encouraged to limit soda, tea, and coffee and be sure to increase water intake.   RV prn

## 2018-01-07 NOTE — Patient Instructions (Addendum)
Vaginal Yeast infection, Adult Vaginal yeast infection is a condition that causes soreness, swelling, and redness (inflammation) of the vagina. It also causes vaginal discharge. This is a common condition. Some women get this infection frequently. What are the causes? This condition is caused by a change in the normal balance of the yeast (candida) and bacteria that live in the vagina. This change causes an overgrowth of yeast, which causes the inflammation. What increases the risk? This condition is more likely to develop in:  Women who take antibiotic medicines.  Women who have diabetes.  Women who take birth control pills.  Women who are pregnant.  Women who douche often.  Women who have a weak defense (immune) system.  Women who have been taking steroid medicines for a long time.  Women who frequently wear tight clothing.  What are the signs or symptoms? Symptoms of this condition include:  White, thick vaginal discharge.  Swelling, itching, redness, and irritation of the vagina. The lips of the vagina (vulva) may be affected as well.  Pain or a burning feeling while urinating.  Pain during sex.  How is this diagnosed? This condition is diagnosed with a medical history and physical exam. This will include a pelvic exam. Your health care provider will examine a sample of your vaginal discharge under a microscope. Your health care provider may send this sample for testing to confirm the diagnosis. How is this treated? This condition is treated with medicine. Medicines may be over-the-counter or prescription. You may be told to use one or more of the following:  Medicine that is taken orally.  Medicine that is applied as a cream.  Medicine that is inserted directly into the vagina (suppository).  Follow these instructions at home:  Take or apply over-the-counter and prescription medicines only as told by your health care provider.  Do not have sex until your health  care provider has approved. Tell your sex partner that you have a yeast infection. That person should go to his or her health care provider if he or she develops symptoms.  Do not wear tight clothes, such as pantyhose or tight pants.  Avoid using tampons until your health care provider approves.  Eat more yogurt. This may help to keep your yeast infection from returning.  Try taking a sitz bath to help with discomfort. This is a warm water bath that is taken while you are sitting down. The water should only come up to your hips and should cover your buttocks. Do this 3-4 times per day or as told by your health care provider.  Do not douche.  Wear breathable, cotton underwear.  If you have diabetes, keep your blood sugar levels under control. Contact a health care provider if:  You have a fever.  Your symptoms go away and then return.  Your symptoms do not get better with treatment.  Your symptoms get worse.  You have new symptoms.  You develop blisters in or around your vagina.  You have blood coming from your vagina and it is not your menstrual period.  You develop pain in your abdomen. This information is not intended to replace advice given to you by your health care provider. Make sure you discuss any questions you have with your health care provider. Document Released: 02/11/2005 Document Revised: 10/16/2015 Document Reviewed: 11/05/2014 Elsevier Interactive Patient Education  2018 [employer]    We will call you with your lab results.

## 2018-01-08 LAB — VAGINITIS/VAGINOSIS, DNA PROBE
Candida Species: NEGATIVE
Gardnerella vaginalis: NEGATIVE
Trichomonas vaginosis: NEGATIVE

## 2018-01-08 LAB — URINE CULTURE: ORGANISM ID, BACTERIA: NO GROWTH

## 2018-02-08 ENCOUNTER — Encounter: Payer: Self-pay | Admitting: Certified Nurse Midwife

## 2018-02-08 ENCOUNTER — Other Ambulatory Visit (HOSPITAL_COMMUNITY)
Admission: RE | Admit: 2018-02-08 | Discharge: 2018-02-08 | Disposition: A | Discharge status: Home / Self Care | Payer: BLUE CROSS/BLUE SHIELD | Source: Ambulatory Visit | Attending: Obstetrics & Gynecology | Admitting: Obstetrics & Gynecology

## 2018-02-08 ENCOUNTER — Ambulatory Visit: Payer: BLUE CROSS/BLUE SHIELD | Admitting: Certified Nurse Midwife

## 2018-02-08 ENCOUNTER — Ambulatory Visit (INDEPENDENT_AMBULATORY_CARE_PROVIDER_SITE_OTHER): Payer: BLUE CROSS/BLUE SHIELD | Admitting: Certified Nurse Midwife

## 2018-02-08 ENCOUNTER — Other Ambulatory Visit: Payer: Self-pay

## 2018-02-08 VITALS — BP 120/70 | HR 68 | Resp 16 | Ht <= 58 in | Wt 88.0 lb

## 2018-02-08 DIAGNOSIS — Z789 Other specified health status: Secondary | ICD-10-CM

## 2018-02-08 DIAGNOSIS — Z01419 Encounter for gynecological examination (general) (routine) without abnormal findings: Secondary | ICD-10-CM

## 2018-02-08 DIAGNOSIS — Z124 Encounter for screening for malignant neoplasm of cervix: Secondary | ICD-10-CM

## 2018-02-08 DIAGNOSIS — N951 Menopausal and female climacteric states: Secondary | ICD-10-CM | POA: Diagnosis not present

## 2018-02-08 NOTE — Progress Notes (Signed)
52 y.o. [id number] Married  viet[place] Fe here for annual exam. Menopausal no HRT. Denies vaginal bleeding. Denies vaginal dryness. Has labs done at work, all normal per patient. Sees Urgent care if needed. Drinking limited amounts of water, to keep from having acid reflux.  Will schedule mammogram in 10/19. No problems today per interpretor.  Interpretor:Betty #[id number]  Patient's last menstrual period was 05/18/2016.          Sexually active: Yes.    The current method of family planning is post menopausal status.    Exercising: Yes.    some Smoker:  no  Review of Systems  Constitutional: Negative.   HENT: Negative.   Eyes: Negative.   Respiratory: Negative.   Cardiovascular: Negative.   Gastrointestinal: Negative.   Genitourinary: Negative.   Musculoskeletal: Negative.   Skin: Negative.   Neurological: Negative.   Endo/Heme/Allergies: Negative.   Psychiatric/Behavioral: Negative.     Health Maintenance: Pap:  01-07-15 neg HPV HR neg History of Abnormal Pap: no MMG:  02-23-17 category c density birads 1:neg  Self Breast exams: no Colonoscopy:  2018 BMD:   none TDaP:  2013 Shingles: unsure Pneumonia: not done Hep C and HIV:not done Labs: with work only    reports that she has never smoked. She has never used smokeless tobacco. She reports that she does not drink alcohol or use drugs.  Past Medical History:  Diagnosis Date  . Abnormal uterine bleeding    resolved  . Enlarged thyroid     Past Surgical History:  Procedure Laterality Date  . APPENDECTOMY    . BREAST BIOPSY Right pt unsure  . 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 fluconazole (DIFLUCAN) 150 MG tablet Take one tablet today and take second pill on Wednesday 2 tablet 0   No current facility-administered medications for this visit.     Family History  Problem Relation  Age of Onset  . Diabetes Mother   . Heart defect Brother     ROS:  Pertinent items are noted in HPI.  Otherwise, a comprehensive ROS was negative.  Exam:   LMP 05/18/2016    Ht Readings from Last 3 Encounters:  01/07/18 4' 9.75" (1.467 m)  09/24/17 4' 9.75" (1.467 m)  02/02/17 4' 9.75" (1.467 m)    General appearance: alert, cooperative and appears stated age Head: Normocephalic, without obvious abnormality, atraumatic Neck: no adenopathy, supple, symmetrical, trachea midline and thyroid normal to inspection and palpation Lungs: clear to auscultation bilaterally Breasts: normal appearance, no masses or tenderness, No nipple retraction or dimpling, No nipple discharge or bleeding, No axillary or supraclavicular adenopathy Heart: regular rate and rhythm Abdomen: soft, non-tender; no masses,  no organomegaly Extremities: extremities normal, atraumatic, no cyanosis or edema Skin: Skin color, texture, turgor normal. No rashes or lesions Lymph nodes: Cervical, supraclavicular, and axillary nodes normal. No abnormal inguinal nodes palpated Neurologic: Grossly normal   Pelvic: External genitalia:  no lesions              Urethra:  normal appearing urethra with no masses, tenderness or lesions              Bartholin's and Skene's: normal                 Vagina: normal appearing vagina with normal color and discharge, no lesions  Cervix: no cervical motion tenderness, no lesions and normal appearance              Pap taken: Yes.   Bimanual Exam:  Uterus:  normal size, contour, position, consistency, mobility, non-tender and anteverted              Adnexa: normal adnexa and no mass, fullness, tenderness               Rectovaginal: Confirms               Anus:  normal sphincter tone, no lesions  Chaperone present: yes  A:  Well Woman with normal exam  Menopausal no HRT  Non english speaking used Interpreter for all conversation and recommendations  Vaginal  dryness  Mammogram due, patient will schedule  P:   Reviewed health and wellness pertinent to exam  Discussed need to call if vaginal bleeding occurs, this would not be normal.  Discussed vaginal dryness finding and coconut oil use for moisturizer. Patient cooks with so no issues with allergy. Questions answered.  Taught SBE and given card to do at home. Discussed mammogram due 10/19, given information sheet to call. Has someone at home who can help.  Pap smear: yes   counseled on breast self exam, mammography screening, feminine hygiene, adequate intake of calcium and vitamin D, diet and exercise  return annually or prn  An After Visit Summary was printed and given to the patient.

## 2018-02-08 NOTE — Patient Instructions (Signed)

## 2018-02-10 LAB — CYTOLOGY - PAP
Diagnosis: NEGATIVE
HPV: NOT DETECTED

## 2018-04-27 ENCOUNTER — Other Ambulatory Visit: Payer: Self-pay | Admitting: Certified Nurse Midwife

## 2018-04-27 DIAGNOSIS — Z1231 Encounter for screening mammogram for malignant neoplasm of breast: Secondary | ICD-10-CM

## 2018-05-24 ENCOUNTER — Ambulatory Visit
Admission: RE | Admit: 2018-05-24 | Discharge: 2018-05-24 | Disposition: A | Discharge status: Home / Self Care | Payer: BLUE CROSS/BLUE SHIELD | Source: Ambulatory Visit | Attending: Certified Nurse Midwife | Admitting: Certified Nurse Midwife

## 2018-05-24 DIAGNOSIS — Z1231 Encounter for screening mammogram for malignant neoplasm of breast: Secondary | ICD-10-CM | POA: Diagnosis not present

## 2019-02-14 ENCOUNTER — Ambulatory Visit (INDEPENDENT_AMBULATORY_CARE_PROVIDER_SITE_OTHER): Payer: BC Managed Care – PPO | Admitting: Certified Nurse Midwife

## 2019-02-14 ENCOUNTER — Encounter: Payer: Self-pay | Admitting: Certified Nurse Midwife

## 2019-02-14 ENCOUNTER — Other Ambulatory Visit: Payer: Self-pay

## 2019-02-14 VITALS — BP 120/68 | HR 64 | Temp 97.0°F | Resp 16 | Ht <= 58 in | Wt 88.0 lb

## 2019-02-14 DIAGNOSIS — E559 Vitamin D deficiency, unspecified: Secondary | ICD-10-CM | POA: Diagnosis not present

## 2019-02-14 DIAGNOSIS — Z01419 Encounter for gynecological examination (general) (routine) without abnormal findings: Secondary | ICD-10-CM | POA: Diagnosis not present

## 2019-02-14 DIAGNOSIS — Z0000 Encounter for general adult medical examination without abnormal findings: Secondary | ICD-10-CM

## 2019-02-14 DIAGNOSIS — N951 Menopausal and female climacteric states: Secondary | ICD-10-CM | POA: Diagnosis not present

## 2019-02-14 DIAGNOSIS — Z789 Other specified health status: Secondary | ICD-10-CM

## 2019-02-14 DIAGNOSIS — Z603 Acculturation difficulty: Secondary | ICD-10-CM

## 2019-02-14 NOTE — Patient Instructions (Signed)

## 2019-02-14 NOTE — Progress Notes (Signed)
53 y.o. [id number] Married  [place] Fe here for annual exam. Non english speaking. Interpreter here for assistance. Eating vegan diet now to stay healthy. Denies vaginal bleeding or vaginal dryness. No weight change. Marland Kitchen No change in thyroid that patient is aware of. Has had problem for many years. Fasted for labs today. No health issues today.  Patient's last menstrual period was 05/18/2016.        Sexually active: Yes.    The current method of family planning is post menopausal status.    Exercising: Yes.    some Smoker:  no  Review of Systems  Constitutional: Negative.   HENT: Negative.   Eyes: Negative.   Respiratory: Negative.   Cardiovascular: Negative.   Gastrointestinal: Negative.   Genitourinary: Negative.   Musculoskeletal: Negative.   Skin: Negative.   Neurological: Negative.   Endo/Heme/Allergies: Negative.   Psychiatric/Behavioral: Negative.     Health Maintenance: Pap:  01-07-15 neg HPV HR neg, 02-08-18 neg HPV HR neg History of Abnormal Pap: no MMG:  05-24-2018 category b density birads 1:neg Self Breast exams: occ Colonoscopy:  2018 BMD:   not done TDaP:  2013 Shingles: not done Pneumonia: not done Hep C and HIV: not done Labs: yes   reports that she has never smoked. She has never used smokeless tobacco. She reports that she does not drink alcohol or use drugs.  Past Medical History:  Diagnosis Date  . Abnormal uterine bleeding    resolved  . Enlarged thyroid     Past Surgical History:  Procedure Laterality Date  . APPENDECTOMY    . BREAST BIOPSY Right pt unsure  . 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 Multiple Vitamins-Minerals (MULTIVITAMIN PO) Take by mouth.    . Omega-3 Fatty Acids (FISH OIL PO) Take by mouth.     No current facility-administered medications for this visit.     Family History  Problem  Relation Age of Onset  . Diabetes Mother   . Heart defect Brother     ROS:  Pertinent items are noted in HPI.  Otherwise, a comprehensive ROS was negative.  Exam:   LMP 05/18/2016    Ht Readings from Last 3 Encounters:  02/08/18 4' 9.75" (1.467 m)  01/07/18 4' 9.75" (1.467 m)  09/24/17 4' 9.75" (1.467 m)    General appearance: alert, cooperative and appears stated age Head: Normocephalic, without obvious abnormality, atraumatic Neck: no adenopathy, supple, symmetrical, trachea midline and thyroid enlarged, no nodules noted, no change in size Lungs: clear to auscultation bilaterally Breasts: normal appearance, no masses or tenderness, No nipple retraction or dimpling, No nipple discharge or bleeding, No axillary or supraclavicular adenopathy, smal breast bilateral Heart: regular rate and rhythm Abdomen: soft, non-tender; no masses,  no organomegaly Extremities: extremities normal, atraumatic, no cyanosis or edema Skin: Skin color, texture, turgor normal. No rashes or lesions Lymph nodes: Cervical, supraclavicular, and axillary nodes normal. No abnormal inguinal nodes palpated Neurologic: Grossly normal   Pelvic: External genitalia:  no lesions              Urethra:  normal appearing urethra with no masses, tenderness or lesions              Bartholin's and Skene's: normal                 Vagina: normal appearing vagina  with normal color and discharge, no lesions              Cervix: no cervical motion tenderness, no lesions and normal appearance              Pap taken: No. Bimanual Exam:  Uterus:  normal size, contour, position, consistency, mobility, non-tender and anteverted              Adnexa: normal adnexa and no mass, fullness, tenderness               Rectovaginal: Confirms               Anus:  normal sphincter tone, no lesions  Chaperone present: yes  A:  Well Woman with normal exam  Post menopausal  No HRT  History of enlarged thyroid no change in size  Non  english speaking interpreter here with patient  Screening labs  P:   Reviewed health and wellness pertinent to exam  Aware of need to advise if vaginal bleeding  If notices change in size or difficulty swallowing needs to advise  Labs: TSH, Lipid panel, CMP, Vitamin D, CBC  Pap smear: no   counseled on breast self exam, mammography screening, feminine hygiene, menopause, adequate intake of calcium and vitamin D, diet and exercise  return annually or prn  An After Visit Summary was printed and given to the patient.

## 2019-02-15 ENCOUNTER — Other Ambulatory Visit: Payer: Self-pay | Admitting: Certified Nurse Midwife

## 2019-02-15 ENCOUNTER — Telehealth: Payer: Self-pay

## 2019-02-15 DIAGNOSIS — R899 Unspecified abnormal finding in specimens from other organs, systems and tissues: Secondary | ICD-10-CM

## 2019-02-15 DIAGNOSIS — E559 Vitamin D deficiency, unspecified: Secondary | ICD-10-CM

## 2019-02-15 LAB — CBC
Hematocrit: 44.6 % (ref 34.0–46.6)
Hemoglobin: 15 g/dL (ref 11.1–15.9)
MCH: 30.8 pg (ref 26.6–33.0)
MCHC: 33.6 g/dL (ref 31.5–35.7)
MCV: 92 fL (ref 79–97)
Platelets: 239 10*3/uL (ref 150–450)
RBC: 4.87 x10E6/uL (ref 3.77–5.28)
RDW: 12.9 % (ref 11.7–15.4)
WBC: 6.3 10*3/uL (ref 3.4–10.8)

## 2019-02-15 LAB — LIPID PANEL
Chol/HDL Ratio: 3.6 ratio (ref 0.0–4.4)
Cholesterol, Total: 235 mg/dL — ABNORMAL HIGH (ref 100–199)
HDL: 66 mg/dL (ref >39)
LDL Chol Calc (NIH): 148 mg/dL — ABNORMAL HIGH (ref 0–99)
Triglycerides: 118 mg/dL (ref 0–149)
VLDL Cholesterol Cal: 21 mg/dL (ref 5–40)

## 2019-02-15 LAB — COMPREHENSIVE METABOLIC PANEL
ALT: 12 IU/L (ref 0–32)
AST: 21 IU/L (ref 0–40)
Albumin/Globulin Ratio: 1.7 (ref 1.2–2.2)
Albumin: 4.3 g/dL (ref 3.8–4.9)
Alkaline Phosphatase: 79 IU/L (ref 39–117)
BUN/Creatinine Ratio: 20 (ref 9–23)
BUN: 11 mg/dL (ref 6–24)
Bilirubin Total: 0.9 mg/dL (ref 0.0–1.2)
CO2: 23 mmol/L (ref 20–29)
Calcium: 9 mg/dL (ref 8.7–10.2)
Chloride: 101 mmol/L (ref 96–106)
Creatinine, Ser: 0.54 mg/dL — ABNORMAL LOW (ref 0.57–1.00)
GFR calc Af Amer: 125 mL/min/{1.73_m2} (ref >59)
GFR calc non Af Amer: 108 mL/min/{1.73_m2} (ref >59)
Globulin, Total: 2.5 g/dL (ref 1.5–4.5)
Glucose: 85 mg/dL (ref 65–99)
Potassium: 4.6 mmol/L (ref 3.5–5.2)
Sodium: 141 mmol/L (ref 134–144)
Total Protein: 6.8 g/dL (ref 6.0–8.5)

## 2019-02-15 LAB — TSH: TSH: 0.781 u[IU]/mL (ref 0.450–4.500)

## 2019-02-15 LAB — VITAMIN D 25 HYDROXY (VIT D DEFICIENCY, FRACTURES): Vit D, 25-Hydroxy: 19.9 ng/mL — ABNORMAL LOW (ref 30.0–100.0)

## 2019-02-15 NOTE — Telephone Encounter (Signed)
Left message for patients daughter to call us back to give her the lab results. DPR signed to speak to daughter Maudie Mercury

## 2019-02-15 NOTE — Telephone Encounter (Signed)
-----   Message from Regina Eck, CNM sent at 02/15/2019  1:22 PM EDT ----- Notify patient her cholesterol is 235 borderline high. Work on fish, vegetables in diet. Regular exercise Triglycerides are normal at 118, HDL is 66 normal, LDL is elevated at 148.  Recheck in 3 months. Liver, kidney and glucose normal TSH is normal CBC is normal Vitamin D is low. Start on Vitamin D 3 2000 Iu daily and recheck in 3 months orders placed

## 2019-02-17 NOTE — Telephone Encounter (Signed)
Left message for call back.

## 2019-02-20 NOTE — Telephone Encounter (Signed)
No callback from patients daughter. Letter sent.

## 2019-03-07 NOTE — Telephone Encounter (Signed)
Per Melvia Heaps, after no callback from patient, okay to close encounter. See result note regarding this.

## 2019-08-09 ENCOUNTER — Encounter: Payer: Self-pay | Admitting: Certified Nurse Midwife

## 2019-09-04 ENCOUNTER — Other Ambulatory Visit: Payer: Self-pay | Admitting: Obstetrics and Gynecology

## 2019-09-04 ENCOUNTER — Other Ambulatory Visit: Payer: Self-pay | Admitting: Certified Nurse Midwife

## 2019-09-04 DIAGNOSIS — Z1231 Encounter for screening mammogram for malignant neoplasm of breast: Secondary | ICD-10-CM

## 2019-09-08 ENCOUNTER — Ambulatory Visit: Payer: Self-pay

## 2019-10-20 ENCOUNTER — Other Ambulatory Visit: Payer: Self-pay

## 2019-10-20 ENCOUNTER — Ambulatory Visit
Admission: RE | Admit: 2019-10-20 | Discharge: 2019-10-20 | Disposition: A | Discharge status: Home / Self Care | Payer: BC Managed Care – PPO | Source: Ambulatory Visit | Attending: Obstetrics and Gynecology | Admitting: Obstetrics and Gynecology

## 2019-10-20 DIAGNOSIS — Z1231 Encounter for screening mammogram for malignant neoplasm of breast: Secondary | ICD-10-CM

## 2019-12-13 DIAGNOSIS — M79641 Pain in right hand: Secondary | ICD-10-CM | POA: Insufficient documentation

## 2019-12-13 DIAGNOSIS — M79642 Pain in left hand: Secondary | ICD-10-CM | POA: Insufficient documentation

## 2019-12-15 DIAGNOSIS — M6530 Trigger finger, unspecified finger: Secondary | ICD-10-CM | POA: Insufficient documentation

## 2019-12-15 DIAGNOSIS — M79642 Pain in left hand: Secondary | ICD-10-CM | POA: Diagnosis not present

## 2019-12-15 DIAGNOSIS — M65332 Trigger finger, left middle finger: Secondary | ICD-10-CM | POA: Diagnosis not present

## 2020-02-19 ENCOUNTER — Ambulatory Visit: Payer: BC Managed Care – PPO | Admitting: Certified Nurse Midwife

## 2020-03-06 ENCOUNTER — Ambulatory Visit (INDEPENDENT_AMBULATORY_CARE_PROVIDER_SITE_OTHER): Payer: BC Managed Care – PPO | Admitting: Obstetrics and Gynecology

## 2020-03-06 ENCOUNTER — Encounter: Payer: Self-pay | Admitting: Obstetrics and Gynecology

## 2020-03-06 ENCOUNTER — Other Ambulatory Visit: Payer: Self-pay

## 2020-03-06 VITALS — BP 138/72 | HR 80 | Resp 12 | Ht <= 58 in | Wt 89.0 lb

## 2020-03-06 DIAGNOSIS — N952 Postmenopausal atrophic vaginitis: Secondary | ICD-10-CM | POA: Diagnosis not present

## 2020-03-06 DIAGNOSIS — Z0000 Encounter for general adult medical examination without abnormal findings: Secondary | ICD-10-CM

## 2020-03-06 DIAGNOSIS — Z01419 Encounter for gynecological examination (general) (routine) without abnormal findings: Secondary | ICD-10-CM

## 2020-03-06 DIAGNOSIS — N941 Unspecified dyspareunia: Secondary | ICD-10-CM

## 2020-03-06 DIAGNOSIS — E559 Vitamin D deficiency, unspecified: Secondary | ICD-10-CM | POA: Diagnosis not present

## 2020-03-06 NOTE — Progress Notes (Signed)
54 y.o. [id number] Married Asian Not Hispanic or Latino female here for annual exam.  No vaginal bleeding. She is occasionally sexually active with some discomfort, not using a lubricant.  No bowel or bladder c/o.     Patient's last menstrual period was 05/18/2016.          Sexually active: Yes.    The current method of family planning is post menopausal status.    Exercising: Yes.    stretching Smoker:  no  Health Maintenance: Pap:  02-08-18 neg HPV HR neg,  01-07-15 neg HPV HR neg History of abnormal Pap:  no MMG: 10-24-19 Density B Bi-rads 1 neg  BMD:   None  Colonoscopy: 2018 f/u 10 years  TDaP:  11/12/2011  Gardasil: NA   reports that she has never smoked. She has never used smokeless tobacco. She reports that she does not drink alcohol and does not use drugs. 3 daughters  Past Medical History:  Diagnosis Date  . Abnormal uterine bleeding    resolved  . Enlarged thyroid     Past Surgical History:  Procedure Laterality Date  . APPENDECTOMY    . BREAST BIOPSY Right pt unsure  . 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   No current facility-administered medications for this visit.    Family History  Problem Relation Age of Onset  . Diabetes Mother   . Heart defect Brother     Review of Systems  All other systems reviewed and are negative.   Exam:   BP 138/72 (BP Location: Right Arm, Patient Position: Sitting, Cuff Size: Normal)   Pulse 80   Resp 12   Ht 4' 9.5" (1.461 m)   Wt 89 lb (40.4 kg)   LMP 05/18/2016   BMI 18.93 kg/m   Weight change: @WEIGHTCHANGE @ Height:   Height: 4' 9.5" (146.1 cm)  Ht Readings from Last 3 Encounters:  03/06/20 4' 9.5" (1.461 m)  02/14/19 4\' 10"  (1.473 m)  02/08/18 4' 9.75" (1.467 m)    General appearance: alert, cooperative and appears stated age Head: Normocephalic, without obvious abnormality, atraumatic Neck: no  adenopathy, supple, symmetrical, trachea midline and thyroid normal to inspection and palpation Lungs: clear to auscultation bilaterally Cardiovascular: regular rate and rhythm Breasts: normal appearance, no masses or tenderness Abdomen: soft, non-tender; non distended,  no masses,  no organomegaly Extremities: extremities normal, atraumatic, no cyanosis or edema Skin: Skin color, texture, turgor normal. No rashes or lesions Lymph nodes: Cervical, supraclavicular, and axillary nodes normal. No abnormal inguinal nodes palpated Neurologic: Grossly normal   Pelvic: External genitalia:  no lesions              Urethra:  normal appearing urethra with no masses, tenderness or lesions              Bartholins and Skenes: normal                 Vagina: atrophic appearing vagina with normal color and discharge, no lesions              Cervix: no lesions               Bimanual Exam:  Uterus:  normal size, contour, position, consistency, mobility, non-tender              Adnexa: no mass, fullness, tenderness  Rectovaginal: Confirms               Anus:  normal sphincter tone, no lesions  Carolynn Serve chaperoned for the exam.  A:  Well Woman with normal exam  Vit d def  Vaginal atrophy with some dyspareunia  P:   No pap this year  Mammogram and colonoscopy are UTD  Screening labs, vit D  Discussed breast self exam  Discussed calcium and vit D intake  Discussed vaginal lubricants, if this isn't helping she should let me know.

## 2020-03-06 NOTE — Patient Instructions (Addendum)
EXERCISE AND DIET:  We recommended that you start or continue a regular exercise program for good health. Regular exercise means any activity that makes your heart beat faster and makes you sweat.  We recommend exercising at least 30 minutes per day at least 3 days a week, preferably 4 or 5.  We also recommend a diet low in fat and sugar.  Inactivity, poor dietary choices and obesity can cause diabetes, heart attack, stroke, and kidney damage, among others.    ALCOHOL AND SMOKING:  Women should limit their alcohol intake to no more than 7 drinks/beers/glasses of wine (combined, not each!) per week. Moderation of alcohol intake to this level decreases your risk of breast cancer and liver damage. And of course, no recreational drugs are part of a healthy lifestyle.  And absolutely no smoking or even second hand smoke. Most people know smoking can cause heart and lung diseases, but did you know it also contributes to weakening of your bones? Aging of your skin?  Yellowing of your teeth and nails?  CALCIUM AND VITAMIN D:  Adequate intake of calcium and Vitamin D are recommended.  The recommendations for exact amounts of these supplements seem to change often, but generally speaking 1,200 mg of calcium (between diet and supplement) and 800 units of Vitamin D per day seems prudent. Certain women may benefit from higher intake of Vitamin D.  If you are among these women, your doctor will have told you during your visit.    PAP SMEARS:  Pap smears, to check for cervical cancer or precancers,  have traditionally been done yearly, although recent scientific advances have shown that most women can have pap smears less often.  However, every woman still should have a physical exam from her gynecologist every year. It will include a breast check, inspection of the vulva and vagina to check for abnormal growths or skin changes, a visual exam of the cervix, and then an exam to evaluate the size and shape of the uterus and  ovaries.  And after 54 years of age, a rectal exam is indicated to check for rectal cancers. We will also provide age appropriate advice regarding health maintenance, like when you should have certain vaccines, screening for sexually transmitted diseases, bone density testing, colonoscopy, mammograms, etc.   MAMMOGRAMS:  All women over 40 years old should have a yearly mammogram. Many facilities now offer a "3D" mammogram, which may cost around $50 extra out of pocket. If possible,  we recommend you accept the option to have the 3D mammogram performed.  It both reduces the number of women who will be called back for extra views which then turn out to be normal, and it is better than the routine mammogram at detecting truly abnormal areas.    COLON CANCER SCREENING: Now recommend starting at age 45. At this time colonoscopy is not covered for routine screening until 50. There are take home tests that can be done between 45-49.   COLONOSCOPY:  Colonoscopy to screen for colon cancer is recommended for all women at age 50.  We know, you hate the idea of the prep.  We agree, BUT, having colon cancer and not knowing it is worse!!  Colon cancer so often starts as a polyp that can be seen and removed at colonscopy, which can quite literally save your life!  And if your first colonoscopy is normal and you have no family history of colon cancer, most women don't have to have it again for   10 years.  Once every ten years, you can do something that may end up saving your life, right?  We will be happy to help you get it scheduled when you are ready.  Be sure to check your insurance coverage so you understand how much it will cost.  It may be covered as a preventative service at no cost, but you should check your particular policy.      Breast Self-Awareness Breast self-awareness means being familiar with how your breasts look and feel. It involves checking your breasts regularly and reporting any changes to your  health care provider. Practicing breast self-awareness is important. A change in your breasts can be a sign of a serious medical problem. Being familiar with how your breasts look and feel allows you to find any problems early, when treatment is more likely to be successful. All women should practice breast self-awareness, including women who have had breast implants. How to do a breast self-exam One way to learn what is normal for your breasts and whether your breasts are changing is to do a breast self-exam. To do a breast self-exam: Look for Changes  1. Remove all the clothing above your waist. 2. Stand in front of a mirror in a room with good lighting. 3. Put your hands on your hips. 4. Push your hands firmly downward. 5. Compare your breasts in the mirror. Look for differences between them (asymmetry), such as: ? Differences in shape. ? Differences in size. ? Puckers, dips, and bumps in one breast and not the other. 6. Look at each breast for changes in your skin, such as: ? Redness. ? Scaly areas. 7. Look for changes in your nipples, such as: ? Discharge. ? Bleeding. ? Dimpling. ? Redness. ? A change in position. Feel for Changes Carefully feel your breasts for lumps and changes. It is best to do this while lying on your back on the floor and again while sitting or standing in the shower or tub with soapy water on your skin. Feel each breast in the following way:  Place the arm on the side of the breast you are examining above your head.  Feel your breast with the other hand.  Start in the nipple area and make  inch (2 cm) overlapping circles to feel your breast. Use the pads of your three middle fingers to do this. Apply light pressure, then medium pressure, then firm pressure. The light pressure will allow you to feel the tissue closest to the skin. The medium pressure will allow you to feel the tissue that is a little deeper. The firm pressure will allow you to feel the tissue  close to the ribs.  Continue the overlapping circles, moving downward over the breast until you feel your ribs below your breast.  Move one finger-width toward the center of the body. Continue to use the  inch (2 cm) overlapping circles to feel your breast as you move slowly up toward your collarbone.  Continue the up and down exam using all three pressures until you reach your armpit.  Write Down What You Find  Write down what is normal for each breast and any changes that you find. Keep a written record with breast changes or normal findings for each breast. By writing this information down, you do not need to depend only on memory for size, tenderness, or location. Write down where you are in your menstrual cycle, if you are still menstruating. If you are having trouble noticing differences   in your breasts, do not get discouraged. With time you will become more familiar with the variations in your breasts and more comfortable with the exam. How often should I examine my breasts? Examine your breasts every month. If you are breastfeeding, the best time to examine your breasts is after a feeding or after using a breast pump. If you menstruate, the best time to examine your breasts is 5-7 days after your period is over. During your period, your breasts are lumpier, and it may be more difficult to notice changes. When should I see my health care provider? See your health care provider if you notice:  A change in shape or size of your breasts or nipples.  A change in the skin of your breast or nipples, such as a reddened or scaly area.  Unusual discharge from your nipples.  A lump or thick area that was not there before.  Pain in your breasts.  Anything that concerns you.   Atrophic Vaginitis  Atrophic vaginitis is a condition in which the tissues that line the vagina become dry and thin. This condition is most common in women who have stopped having regular menstrual periods (are in  menopause). This usually starts when a woman is 45-55 years old. That is the time when a woman's estrogen levels begin to drop (decrease). Estrogen is a female hormone. It helps to keep the tissues of the vagina moist. It stimulates the vagina to produce a clear fluid that lubricates the vagina for sexual intercourse. This fluid also protects the vagina from infection. Lack of estrogen can cause the lining of the vagina to get thinner and dryer. The vagina may also shrink in size. It may become less elastic. Atrophic vaginitis tends to get worse over time as a woman's estrogen level drops. What are the causes? This condition is caused by the normal drop in estrogen that happens around the time of menopause. What increases the risk? Certain conditions or situations may lower a woman's estrogen level, leading to a higher risk for atrophic vaginitis. You are more likely to develop this condition if:  You are taking medicines that block estrogen.  You have had your ovaries removed.  You are being treated for cancer with X-ray (radiation) or medicines (chemotherapy).  You have given birth or are breastfeeding.  You are older than age 50.  You smoke. What are the signs or symptoms? Symptoms of this condition include:  Pain, soreness, or bleeding during sexual intercourse (dyspareunia).  Vaginal burning, irritation, or itching.  Pain or bleeding when a speculum is used in a vaginal exam (pelvic exam).  Having burning pain when passing urine.  Vaginal discharge that is brown or yellow. In some cases, there are no symptoms. How is this diagnosed? This condition is diagnosed by taking a medical history and doing a physical exam. This will include a pelvic exam that checks the vaginal tissues. Though rare, you may also have other tests, including:  A urine test.  A test that checks the acid balance in your vagina (acid balance test). How is this treated? Treatment for this condition  depends on how severe your symptoms are. Treatment may include:  Using an over-the-counter vaginal lubricant before sex.  Using a long-acting vaginal moisturizer.  Using low-dose vaginal estrogen for moderate to severe symptoms that do not respond to other treatments. Options include creams, tablets, and inserts (vaginal rings). Before you use a vaginal estrogen, tell your health care provider if you have a history   of: ? Breast cancer. ? Endometrial cancer. ? Blood clots. If you are not sexually active and your symptoms are very mild, you may not need treatment. Follow these instructions at home: Medicines  Take over-the-counter and prescription medicines only as told by your health care provider. Do not use herbal or alternative medicines unless your health care provider says that you can.  Use over-the-counter creams, lubricants, or moisturizers for dryness only as directed by your health care provider. General i Dyspareunia, Female Dyspareunia is pain that is associated with sexual activity. This can affect any part of the genitals or lower abdomen. There are many possible causes of this condition. In some cases, diagnosing the cause of dyspareunia can be difficult. This condition can be mild, moderate, or severe. Depending on the cause, dyspareunia may get better with treatment, but may return (recur) over time. What are the causes?  The cause of this condition is not always known. However, problems that affect the vulva, vagina, uterus, and other organs may cause dyspareunia. Common causes of this condition include:  Vaginal dryness.  Giving birth.  Infection.  Skin changes or conditions.  Side effects of medicines.  Endometriosis. This is when tissue that is like the lining of the uterus grows on the outside of the uterus.  Psychological conditions. These include depression, anxiety, or traumatic experiences.  Allergic reaction. What increases the risk? The following  factors may make you more likely to develop this condition:  History of physical or sexual trauma.  Some medicines.  No longer having a monthly period (menopause).  Having recently given birth.  Taking baths using soaps that have perfumes. These can cause irritation.  Douching. What are the signs or symptoms? The main symptom of this condition is pain in any part of your genitals or lower abdomen during or after sex. This may include:  Irritation, burning, or stinging sensations in your vulva.  Discomfort when your vulva or surrounding area is touched.  Aching and throbbing pain that may be constant.  Pain that gets worse when something is inserted into your vagina. How is this diagnosed? This condition may be diagnosed based on:  Your symptoms, including where and when your pain occurs.  Your medical history.  A physical exam. A pelvic exam will most likely be done.  Tests that include ultrasound, blood tests, and tests that check the body for infection.  Imaging tests, such as X-ray, MRI, and CT scan. You may be referred to a health care provider who specializes in women's health (gynecologist). How is this treated? Treatment depends on the cause of your condition and your symptoms. In most cases, you may need to stop sexual activity until your symptoms go away or get better. Treatment may include:  Lubricants, ointments, and creams.  Physical therapy.  Massage therapy.  Hormonal therapy.  Medicines to: ? Prevent or fight infection. ? Relieve pain. ? Help numb the area. ? Treat depression (antidepressants).  Counseling, which may include sex therapy.  Surgery. Follow these instructions at home: Lifestyle  Wear cotton underwear.  Use water-based lubricants as needed during sex. Avoid oil-based lubricants.  Do not use any products that can cause irritation. This may include certain condoms, spermicides, lubricants, soaps, tampons, vaginal sprays, or  douches.  Always practice safe sex. Use a condom to prevent sexually transmitted infections (STIs).  Talk freely with your partner about your condition. General instructions  Take or apply over-the-counter and prescription medicines only as told by your health care provider.  Urinate before you have sex.  Consider joining a support group.  Get the results of any tests you have done. Ask your health care provider, or the department that is doing the procedure, when your results will be ready.  Keep all follow-up visits as told by your health care provider. This is important. Contact a health care provider if:  You have vaginal bleeding after having sex.  You develop a lump at the opening of your vagina even if the lump is painless.  You have: ? Abnormal discharge from your vagina. ? Vaginal dryness. ? Itchiness or irritation of your vulva or vagina. ? A new rash. ? Symptoms that get worse or do not improve with treatment. ? A fever. ? Pain when you urinate. ? Blood in your urine. Get help right away if:  You have severe pain in your abdomen during or shortly after sex.  You pass out after sex. Summary  Dyspareunia is pain that is associated with sexual activity. This can affect any part of the genitals or lower abdomen.  There are many causes of this condition. Treatment depends on the cause and your symptoms. In most cases, you may need to stop sexual activity until your symptoms improve.  Take or apply over-the-counter and prescription medicines only as told by your health care provider.  Contact a health care provider if your symptoms get worse or do not improve with treatment.  Keep all follow-up visits as told by your health care provider. This is important. This information is not intended to replace advice given to you by your health care provider. Make sure you discuss any questions you have with your health care provider. Document Revised: 07/11/2018 Document  Reviewed: 07/11/2018 Elsevier Patient Education  2020 [employer]. nstructions  If your atrophic vaginitis is caused by menopause, discuss all of your menopause symptoms and treatment options with your health care provider.  Do not douche.  Do not use products that can make your vagina dry. These include: ? Scented feminine sprays. ? Scented tampons. ? Scented soaps.  Vaginal intercourse can help to improve blood flow and elasticity of vaginal tissue. If it hurts to have sex, try using a lubricant or moisturizer just before having intercourse. Contact a health care provider if:  Your discharge looks different than normal.  Your vagina has an unusual smell.  You have new symptoms.  Your symptoms do not improve with treatment.  Your symptoms get worse. Summary  Atrophic vaginitis is a condition in which the tissues that line the vagina become dry and thin. It is most common in women who have stopped having regular menstrual periods (are in menopause).  Treatment options include using vaginal lubricants and low-dose vaginal estrogen.  Contact a health care provider if your vagina has an unusual smell, or if your symptoms get worse or do not improve after treatment. This information is not intended to replace advice given to you by your health care provider. Make sure you discuss any questions you have with your health care provider. Document Revised: 04/16/2017 Document Reviewed: 01/28/2017 Elsevier Patient Education  2020 [employer].

## 2020-03-07 ENCOUNTER — Telehealth: Payer: Self-pay | Admitting: *Deleted

## 2020-03-07 LAB — LIPID PANEL
Chol/HDL Ratio: 3.4 ratio (ref 0.0–4.4)
Cholesterol, Total: 213 mg/dL — ABNORMAL HIGH (ref 100–199)
HDL: 62 mg/dL (ref >39)
LDL Chol Calc (NIH): 124 mg/dL — ABNORMAL HIGH (ref 0–99)
Triglycerides: 155 mg/dL — ABNORMAL HIGH (ref 0–149)
VLDL Cholesterol Cal: 27 mg/dL (ref 5–40)

## 2020-03-07 LAB — CBC
Hematocrit: 41.9 % (ref 34.0–46.6)
Hemoglobin: 14.2 g/dL (ref 11.1–15.9)
MCH: 31.1 pg (ref 26.6–33.0)
MCHC: 33.9 g/dL (ref 31.5–35.7)
MCV: 92 fL (ref 79–97)
Platelets: 227 10*3/uL (ref 150–450)
RBC: 4.57 x10E6/uL (ref 3.77–5.28)
RDW: 12.7 % (ref 11.7–15.4)
WBC: 6.4 10*3/uL (ref 3.4–10.8)

## 2020-03-07 LAB — COMPREHENSIVE METABOLIC PANEL
ALT: 22 IU/L (ref 0–32)
AST: 23 IU/L (ref 0–40)
Albumin/Globulin Ratio: 1.6 (ref 1.2–2.2)
Albumin: 4 g/dL (ref 3.8–4.9)
Alkaline Phosphatase: 85 IU/L (ref 44–121)
BUN/Creatinine Ratio: 15 (ref 9–23)
BUN: 9 mg/dL (ref 6–24)
Bilirubin Total: 0.4 mg/dL (ref 0.0–1.2)
CO2: 26 mmol/L (ref 20–29)
Calcium: 8.8 mg/dL (ref 8.7–10.2)
Chloride: 104 mmol/L (ref 96–106)
Creatinine, Ser: 0.6 mg/dL (ref 0.57–1.00)
GFR calc Af Amer: 120 mL/min/{1.73_m2} (ref >59)
GFR calc non Af Amer: 104 mL/min/{1.73_m2} (ref >59)
Globulin, Total: 2.5 g/dL (ref 1.5–4.5)
Glucose: 71 mg/dL (ref 65–99)
Potassium: 3.4 mmol/L — ABNORMAL LOW (ref 3.5–5.2)
Sodium: 141 mmol/L (ref 134–144)
Total Protein: 6.5 g/dL (ref 6.0–8.5)

## 2020-03-07 LAB — VITAMIN D 25 HYDROXY (VIT D DEFICIENCY, FRACTURES): Vit D, 25-Hydroxy: 22.6 ng/mL — ABNORMAL LOW (ref 30.0–100.0)

## 2020-03-07 NOTE — Telephone Encounter (Signed)
-----   Message from Romualdo Bolk, MD sent at 03/07/2020  8:40 AM EDT ----- Please let the patient know that her vit d is low and have her take 1,000 IU of vit d 3 daily (long term).  Her triglycerides are mildly elevated, but she wasn't fasting so this isn't concerning. Her LDL is mildly elevated, but she otherwise doesn't have risks for heart disease. She is a vegetarian and has a low BMI. She should just have f/u lab work next year. The rest of her blood work is normal.

## 2020-03-07 NOTE — Telephone Encounter (Signed)
Leda Min, RN  03/07/2020 1:11 PM EDT Back to Top    Call placed using [employer] ID #[id number] to home and mobile number, left message to call Noreene Larsson, RN at [facility] [phone].

## 2020-03-14 NOTE — Telephone Encounter (Signed)
Reviewed dpr, call placed to patients daughter, Selena Batten, Left message to call Noreene Larsson, RN at [facility] [phone].

## 2020-03-14 NOTE — Telephone Encounter (Signed)
Isabell Jarvis, RN  03/11/2020 4:43 PM EDT Back to Top    Spoke with Selena Batten, daughter ok per DPR. Given results and recommendations per Dr Oscar La. Kim agreeable and verbalized understanding.  AEX 04/16/2021.   Call returned to patients daughter, Selena Batten, advised to disregard previous message, results reviewed on 03/11/20. No return call needed.   Encounter closed.

## 2020-05-28 DIAGNOSIS — M25521 Pain in right elbow: Secondary | ICD-10-CM | POA: Diagnosis not present

## 2020-05-28 DIAGNOSIS — M79641 Pain in right hand: Secondary | ICD-10-CM | POA: Diagnosis not present

## 2020-05-29 DIAGNOSIS — B009 Herpesviral infection, unspecified: Secondary | ICD-10-CM | POA: Diagnosis not present

## 2020-05-29 DIAGNOSIS — B029 Zoster without complications: Secondary | ICD-10-CM | POA: Diagnosis not present

## 2020-12-20 DIAGNOSIS — Z20822 Contact with and (suspected) exposure to covid-19: Secondary | ICD-10-CM | POA: Diagnosis not present

## 2020-12-20 DIAGNOSIS — Z03818 Encounter for observation for suspected exposure to other biological agents ruled out: Secondary | ICD-10-CM | POA: Diagnosis not present

## 2021-03-18 NOTE — Progress Notes (Signed)
55 y.o. [id number] Married Asian Not Hispanic or Latino female here for annual exam.  No vaginal bleeding.  No bowel or bladder c/o. Infrequent intercourse. No pain.     Patient's last menstrual period was 05/18/2016.          Sexually active: Yes.    The current method of family planning is post menopausal status.    Exercising: Yes.     Light weights  Smoker:  no  Health Maintenance: Pap:   02-08-18 neg HPV HR neg,  01-07-15 neg HPV HR neg History of abnormal Pap:  no MMG:  10/20/19 Bi-rads 1 neg  BMD:   none  Colonoscopy: 2018 f/u 10 years  TDaP:  11/12/11 Gardasil: NA   reports that she has never smoked. She has never used smokeless tobacco. She reports that she does not drink alcohol and does not use drugs.   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     No current facility-administered medications for this visit.    Family History  Problem Relation Age of Onset   Diabetes Mother    Heart defect Brother     Review of Systems  Genitourinary: Negative.   All other systems reviewed and are negative.  Exam:   BP 106/60   Pulse 75   Ht 4' 9.25" (1.454 m)   Wt 85 lb (38.6 kg)   LMP 05/18/2016   SpO2 100%   BMI 18.23 kg/m   Weight change: @WEIGHTCHANGE @ Height:   Height: 4' 9.25" (145.4 cm)  Ht Readings from Last 3 Encounters:  03/20/21 4' 9.25" (1.454 m)  03/06/20 4' 9.5" (1.461 m)  02/14/19 4\' 10"  (1.473 m)    General appearance: alert, cooperative and appears stated age Head: Normocephalic, without obvious abnormality, atraumatic Neck: no adenopathy, supple,  symmetrical, trachea midline and thyroid normal to inspection and palpation Lungs: clear to auscultation bilaterally Cardiovascular: regular rate and rhythm Breasts: normal appearance, no masses or tenderness Abdomen: soft, non-tender; non distended,  no masses,  no organomegaly Extremities: extremities normal, atraumatic, no cyanosis or edema Skin: Skin color, texture, turgor normal. No rashes or lesions Lymph nodes: Cervical, supraclavicular, and axillary nodes normal. No abnormal inguinal nodes palpated Neurologic: Grossly normal   Pelvic: External genitalia:  no lesions              Urethra:  normal appearing urethra with no masses, tenderness or lesions              Bartholins and Skenes: normal                 Vagina: atrophic appearing vagina with normal color and discharge, no lesions              Cervix: no lesions               Bimanual Exam:  Uterus:  normal size, contour, position, consistency, mobility, non-tender              Adnexa: no mass, fullness, tenderness  Rectovaginal: Confirms               Anus:  normal sphincter tone, no lesions  Carolynn Serve chaperoned for the exam.  1. Well woman exam Discussed breast self exam Discussed calcium and vit D intake Mammogram scheduled Colonoscopy UTD No pap today  2. Vitamin D deficiency She is taking vit d - VITAMIN D 25 Hydroxy (Vit-D Deficiency, Fractures) After much discussion, she declines lab work  3. Laboratory exam ordered as part of routine general medical examination - CBC - Comprehensive metabolic panel - Lipid panel Declines blood work  Interpreter present on line throughout the visit

## 2021-03-20 ENCOUNTER — Ambulatory Visit (INDEPENDENT_AMBULATORY_CARE_PROVIDER_SITE_OTHER): Payer: BC Managed Care – PPO | Admitting: Obstetrics and Gynecology

## 2021-03-20 ENCOUNTER — Encounter: Payer: Self-pay | Admitting: Obstetrics and Gynecology

## 2021-03-20 ENCOUNTER — Other Ambulatory Visit: Payer: Self-pay

## 2021-03-20 ENCOUNTER — Other Ambulatory Visit: Payer: Self-pay | Admitting: Obstetrics and Gynecology

## 2021-03-20 VITALS — BP 106/60 | HR 75 | Ht <= 58 in | Wt 85.0 lb

## 2021-03-20 DIAGNOSIS — Z1231 Encounter for screening mammogram for malignant neoplasm of breast: Secondary | ICD-10-CM

## 2021-03-20 DIAGNOSIS — Z0000 Encounter for general adult medical examination without abnormal findings: Secondary | ICD-10-CM | POA: Diagnosis not present

## 2021-03-20 DIAGNOSIS — E559 Vitamin D deficiency, unspecified: Secondary | ICD-10-CM

## 2021-03-20 DIAGNOSIS — Z01419 Encounter for gynecological examination (general) (routine) without abnormal findings: Secondary | ICD-10-CM | POA: Diagnosis not present

## 2021-03-20 NOTE — Patient Instructions (Addendum)
Take 1,000 IU of vit D a day  EXERCISE   We recommended that you start or continue a regular exercise program for good health. Physical activity is anything that gets your body moving, some is better than none. The CDC recommends 150 minutes per week of Moderate-Intensity Aerobic Activity and 2 or more days of Muscle Strengthening Activity.  Benefits of exercise are limitless: helps weight loss/weight maintenance, improves mood and energy, helps with depression and anxiety, improves sleep, tones and strengthens muscles, improves balance, improves bone density, protects from chronic conditions such as heart disease, high blood pressure and diabetes and so much more. To learn more visit: http://kirby-bean.org/  DIET: Good nutrition starts with a healthy diet of fruits, vegetables, whole grains, and lean protein sources. Drink plenty of water for hydration. Minimize empty calories, sodium, sweets. For more information about dietary recommendations visit: CriticalGas.be and https://www.carpenter-henry.info/  ALCOHOL:  Women should limit their alcohol intake to no more than 7 drinks/beers/glasses of wine (combined, not each!) per week. Moderation of alcohol intake to this level decreases your risk of breast cancer and liver damage.  If you are concerned that you may have a problem, or your friends have told you they are concerned about your drinking, there are many resources to help. A well-known program that is free, effective, and available to all people all over the nation is Alcoholics Anonymous.  Check out this site to learn more: BeverageBargains.co.za   CALCIUM AND VITAMIN D:  Adequate intake of calcium and Vitamin D are recommended for bone health.  You should be getting between 1000-1200 mg of calcium and 800 units of Vitamin D daily between diet and supplements  PAP SMEARS:  Pap smears, to check for cervical cancer or  precancers,  have traditionally been done yearly, scientific advances have shown that most women can have pap smears less often.  However, every woman still should have a physical exam from her gynecologist every year. It will include a breast check, inspection of the vulva and vagina to check for abnormal growths or skin changes, a visual exam of the cervix, and then an exam to evaluate the size and shape of the uterus and ovaries. We will also provide age appropriate advice regarding health maintenance, like when you should have certain vaccines, screening for sexually transmitted diseases, bone density testing, colonoscopy, mammograms, etc.   MAMMOGRAMS:  All women over 38 years old should have a routine mammogram.   COLON CANCER SCREENING: Now recommend starting at age 96. At this time colonoscopy is not covered for routine screening until 50. There are take home tests that can be done between 45-49.   COLONOSCOPY:  Colonoscopy to screen for colon cancer is recommended for all women at age 32.  We know, you hate the idea of the prep.  We agree, BUT, having colon cancer and not knowing it is worse!!  Colon cancer so often starts as a polyp that can be seen and removed at colonscopy, which can quite literally save your life!  And if your first colonoscopy is normal and you have no family history of colon cancer, most women don't have to have it again for 10 years.  Once every ten years, you can do something that may end up saving your life, right?  We will be happy to help you get it scheduled when you are ready.  Be sure to check your insurance coverage so you understand how much it will cost.  It may be covered as a  preventative service at no cost, but you should check your particular policy.      Breast Self-Awareness Breast self-awareness means being familiar with how your breasts look and feel. It involves checking your breasts regularly and reporting any changes to your health care  provider. Practicing breast self-awareness is important. A change in your breasts can be a sign of a serious medical problem. Being familiar with how your breasts look and feel allows you to find any problems early, when treatment is more likely to be successful. All women should practice breast self-awareness, including women who have had breast implants. How to do a breast self-exam One way to learn what is normal for your breasts and whether your breasts are changing is to do a breast self-exam. To do a breast self-exam: Look for Changes  Remove all the clothing above your waist. Stand in front of a mirror in a room with good lighting. Put your hands on your hips. Push your hands firmly downward. Compare your breasts in the mirror. Look for differences between them (asymmetry), such as: Differences in shape. Differences in size. Puckers, dips, and bumps in one breast and not the other. Look at each breast for changes in your skin, such as: Redness. Scaly areas. Look for changes in your nipples, such as: Discharge. Bleeding. Dimpling. Redness. A change in position. Feel for Changes Carefully feel your breasts for lumps and changes. It is best to do this while lying on your back on the floor and again while sitting or standing in the shower or tub with soapy water on your skin. Feel each breast in the following way: Place the arm on the side of the breast you are examining above your head. Feel your breast with the other hand. Start in the nipple area and make  inch (2 cm) overlapping circles to feel your breast. Use the pads of your three middle fingers to do this. Apply light pressure, then medium pressure, then firm pressure. The light pressure will allow you to feel the tissue closest to the skin. The medium pressure will allow you to feel the tissue that is a little deeper. The firm pressure will allow you to feel the tissue close to the ribs. Continue the overlapping circles,  moving downward over the breast until you feel your ribs below your breast. Move one finger-width toward the center of the body. Continue to use the  inch (2 cm) overlapping circles to feel your breast as you move slowly up toward your collarbone. Continue the up and down exam using all three pressures until you reach your armpit.  Write Down What You Find  Write down what is normal for each breast and any changes that you find. Keep a written record with breast changes or normal findings for each breast. By writing this information down, you do not need to depend only on memory for size, tenderness, or location. Write down where you are in your menstrual cycle, if you are still menstruating. If you are having trouble noticing differences in your breasts, do not get discouraged. With time you will become more familiar with the variations in your breasts and more comfortable with the exam. How often should I examine my breasts? Examine your breasts every month. If you are breastfeeding, the best time to examine your breasts is after a feeding or after using a breast pump. If you menstruate, the best time to examine your breasts is 5-7 days after your period is over. During your period,   your breasts are lumpier, and it may be more difficult to notice changes. When should I see my health care provider? See your health care provider if you notice: A change in shape or size of your breasts or nipples. A change in the skin of your breast or nipples, such as a reddened or scaly area. Unusual discharge from your nipples. A lump or thick area that was not there before. Pain in your breasts. Anything that concerns you.

## 2021-04-22 ENCOUNTER — Ambulatory Visit: Payer: BC Managed Care – PPO

## 2021-04-29 ENCOUNTER — Ambulatory Visit: Payer: BC Managed Care – PPO

## 2021-05-28 ENCOUNTER — Ambulatory Visit
Admission: RE | Admit: 2021-05-28 | Discharge: 2021-05-28 | Disposition: A | Discharge status: Home / Self Care | Payer: BC Managed Care – PPO | Source: Ambulatory Visit | Attending: Obstetrics and Gynecology | Admitting: Obstetrics and Gynecology

## 2021-05-28 DIAGNOSIS — Z1231 Encounter for screening mammogram for malignant neoplasm of breast: Secondary | ICD-10-CM | POA: Diagnosis not present

## 2022-03-18 NOTE — Progress Notes (Signed)
56 y.o. [id number] Married Asian Not Hispanic or Latino female here for annual exam.  No c/o. No vaginal bleeding. No dyspareunia.   She has had a vit d def in the past. Getting some in     Patient's last menstrual period was 05/18/2016.          Sexually active: Yes.    The current method of family planning is post menopausal status.    Exercising: No.   Just some stretching  Smoker:  no  Health Maintenance: Pap:  02-08-18 neg HPV HR neg,  01-07-15 neg HPV HR neg History of abnormal Pap:  no MMG:  05/28/21 density b bi-rads 1 neg  BMD:   none  Colonoscopy: 2018 f/u 10 years  TDaP:  11/12/11 Gardasil: n/a   reports that she has never smoked. She has never used smokeless tobacco. She reports that she does not drink alcohol and does not use drugs. She has 3 daughters.  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     No current facility-administered medications for this visit.    Family History  Problem Relation Age of Onset   Diabetes Mother    Heart defect Brother     Review of Systems  All other systems reviewed and are negative.   Exam:   BP 110/80   Pulse 72   Ht 4' 9.48" (1.46 m)   Wt 85 lb (38.6 kg)   LMP 05/18/2016   SpO2 100%   BMI 18.09 kg/m   Weight change: @WEIGHTCHANGE @ Height:   Height: 4' 9.48" (146 cm)  Ht Readings from Last 3 Encounters:  03/25/22 4' 9.48" (1.46 m)  03/20/21 4' 9.25" (1.454 m)  03/06/20 4' 9.5" (1.461 m)    General appearance: alert, cooperative and appears stated age Head: Normocephalic, without obvious abnormality,  atraumatic Neck: no adenopathy, supple, symmetrical, trachea midline and thyroid normal to inspection and palpation Lungs: clear to auscultation bilaterally Cardiovascular: regular rate and rhythm Breasts: normal appearance, no masses or tenderness Abdomen: soft, non-tender; non distended,  no masses,  no organomegaly Extremities: extremities normal, atraumatic, no cyanosis or edema Skin: Skin color, texture, turgor normal. No rashes or lesions Lymph nodes: Cervical, supraclavicular, and axillary nodes normal. No abnormal inguinal nodes palpated Neurologic: Grossly normal   Pelvic: External genitalia:  no lesions              Urethra:  normal appearing urethra with no masses, tenderness or lesions              Bartholins and Skenes: normal                 Vagina: very atrophic appearing vagina with normal color and discharge, no lesions              Cervix: no lesions               Bimanual Exam:  Uterus:  normal size, contour, position, consistency, mobility, non-tender  Adnexa: no mass, fullness, tenderness               Rectovaginal: Confirms               Anus:  normal sphincter tone, no lesions    1. Well woman exam Discussed breast self exam Discussed calcium and vit D intake Mammogram in 1/24 Colonoscopy UTD  2. Vitamin D deficiency Getting some vit d in her diet - VITAMIN D 25 Hydroxy (Vit-D Deficiency, Fractures)  3. Screening for cervical cancer - Cytology - PAP  4. Lipids abnormal - Lipid panel  5. Vegetarian - CBC - Comprehensive metabolic panel  6. Laboratory exam ordered as part of routine general medical examination - CBC - Comprehensive metabolic panel  7. Immunization due - Tdap vaccine greater than or equal to 7yo IM

## 2022-03-25 ENCOUNTER — Other Ambulatory Visit (HOSPITAL_COMMUNITY)
Admission: RE | Admit: 2022-03-25 | Discharge: 2022-03-25 | Disposition: A | Discharge status: Home / Self Care | Payer: BC Managed Care – PPO | Source: Ambulatory Visit | Attending: Obstetrics and Gynecology | Admitting: Obstetrics and Gynecology

## 2022-03-25 ENCOUNTER — Ambulatory Visit (INDEPENDENT_AMBULATORY_CARE_PROVIDER_SITE_OTHER): Payer: BC Managed Care – PPO | Admitting: Obstetrics and Gynecology

## 2022-03-25 ENCOUNTER — Encounter: Payer: Self-pay | Admitting: Obstetrics and Gynecology

## 2022-03-25 VITALS — BP 110/80 | HR 72 | Ht <= 58 in | Wt 85.0 lb

## 2022-03-25 DIAGNOSIS — E7889 Other lipoprotein metabolism disorders: Secondary | ICD-10-CM

## 2022-03-25 DIAGNOSIS — E559 Vitamin D deficiency, unspecified: Secondary | ICD-10-CM | POA: Diagnosis not present

## 2022-03-25 DIAGNOSIS — K5901 Slow transit constipation: Secondary | ICD-10-CM | POA: Insufficient documentation

## 2022-03-25 DIAGNOSIS — Z0000 Encounter for general adult medical examination without abnormal findings: Secondary | ICD-10-CM | POA: Diagnosis not present

## 2022-03-25 DIAGNOSIS — Z124 Encounter for screening for malignant neoplasm of cervix: Secondary | ICD-10-CM

## 2022-03-25 DIAGNOSIS — Z01419 Encounter for gynecological examination (general) (routine) without abnormal findings: Secondary | ICD-10-CM

## 2022-03-25 DIAGNOSIS — Z23 Encounter for immunization: Secondary | ICD-10-CM | POA: Diagnosis not present

## 2022-03-25 DIAGNOSIS — Z789 Other specified health status: Secondary | ICD-10-CM

## 2022-03-25 DIAGNOSIS — Z1211 Encounter for screening for malignant neoplasm of colon: Secondary | ICD-10-CM | POA: Insufficient documentation

## 2022-03-25 NOTE — Patient Instructions (Signed)

## 2022-03-26 LAB — CBC
HCT: 44.9 % (ref 35.0–45.0)
Hemoglobin: 15.1 g/dL (ref 11.7–15.5)
MCH: 30.6 pg (ref 27.0–33.0)
MCHC: 33.6 g/dL (ref 32.0–36.0)
MCV: 91.1 fL (ref 80.0–100.0)
MPV: 12.2 fL (ref 7.5–12.5)
Platelets: 251 10*3/uL (ref 140–400)
RBC: 4.93 10*6/uL (ref 3.80–5.10)
RDW: 11.8 % (ref 11.0–15.0)
WBC: 8.9 10*3/uL (ref 3.8–10.8)

## 2022-03-26 LAB — COMPREHENSIVE METABOLIC PANEL
AG Ratio: 2 (calc) (ref 1.0–2.5)
ALT: 17 U/L (ref 6–29)
AST: 20 U/L (ref 10–35)
Albumin: 4.5 g/dL (ref 3.6–5.1)
Alkaline phosphatase (APISO): 77 U/L (ref 37–153)
BUN: 11 mg/dL (ref 7–25)
CO2: 27 mmol/L (ref 20–32)
Calcium: 9.5 mg/dL (ref 8.6–10.4)
Chloride: 104 mmol/L (ref 98–110)
Creat: 0.62 mg/dL (ref 0.50–1.03)
Globulin: 2.3 g/dL (calc) (ref 1.9–3.7)
Glucose, Bld: 89 mg/dL (ref 65–99)
Potassium: 4.6 mmol/L (ref 3.5–5.3)
Sodium: 141 mmol/L (ref 135–146)
Total Bilirubin: 0.7 mg/dL (ref 0.2–1.2)
Total Protein: 6.8 g/dL (ref 6.1–8.1)

## 2022-03-26 LAB — LIPID PANEL
Cholesterol: 196 mg/dL (ref <200)
HDL: 62 mg/dL (ref >=50)
LDL Cholesterol (Calc): 113 mg/dL (calc) — ABNORMAL HIGH
Non-HDL Cholesterol (Calc): 134 mg/dL (calc) — ABNORMAL HIGH (ref <130)
Total CHOL/HDL Ratio: 3.2 (calc) (ref <5.0)
Triglycerides: 99 mg/dL (ref <150)

## 2022-03-26 LAB — VITAMIN D 25 HYDROXY (VIT D DEFICIENCY, FRACTURES): Vit D, 25-Hydroxy: 59 ng/mL (ref 30–100)

## 2022-03-27 LAB — CYTOLOGY - PAP
Comment: NEGATIVE
Diagnosis: NEGATIVE
High risk HPV: NEGATIVE

## 2022-03-30 ENCOUNTER — Other Ambulatory Visit: Payer: Self-pay | Admitting: Obstetrics and Gynecology

## 2022-03-30 DIAGNOSIS — Z1231 Encounter for screening mammogram for malignant neoplasm of breast: Secondary | ICD-10-CM

## 2022-06-02 ENCOUNTER — Ambulatory Visit
Admission: RE | Admit: 2022-06-02 | Discharge: 2022-06-02 | Disposition: A | Discharge status: Home / Self Care | Payer: BC Managed Care – PPO | Source: Ambulatory Visit | Attending: Obstetrics and Gynecology | Admitting: Obstetrics and Gynecology

## 2022-06-02 DIAGNOSIS — Z1231 Encounter for screening mammogram for malignant neoplasm of breast: Secondary | ICD-10-CM | POA: Diagnosis not present

## 2022-10-07 IMAGING — MG MM DIGITAL SCREENING BILAT W/ TOMO AND CAD
8 series · 9 of 24 positions shown · non-contrast
Comparison: Previous exam(s).

CLINICAL DATA: Screening.

EXAM:
DIGITAL SCREENING BILATERAL MAMMOGRAM WITH TOMOSYNTHESIS AND CAD
TECHNIQUE: Bilateral screening digital craniocaudal and mediolateral oblique
mammograms were obtained. Bilateral screening digital breast
tomosynthesis was performed. The images were evaluated with
computer-aided detection.

[L MLO synth-2D]
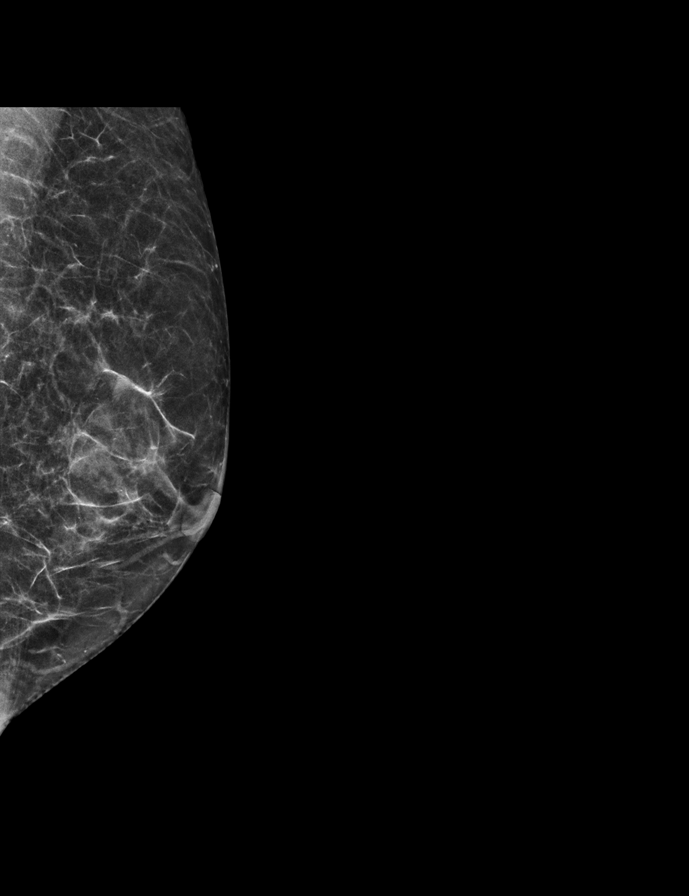

[R CC synth-2D]
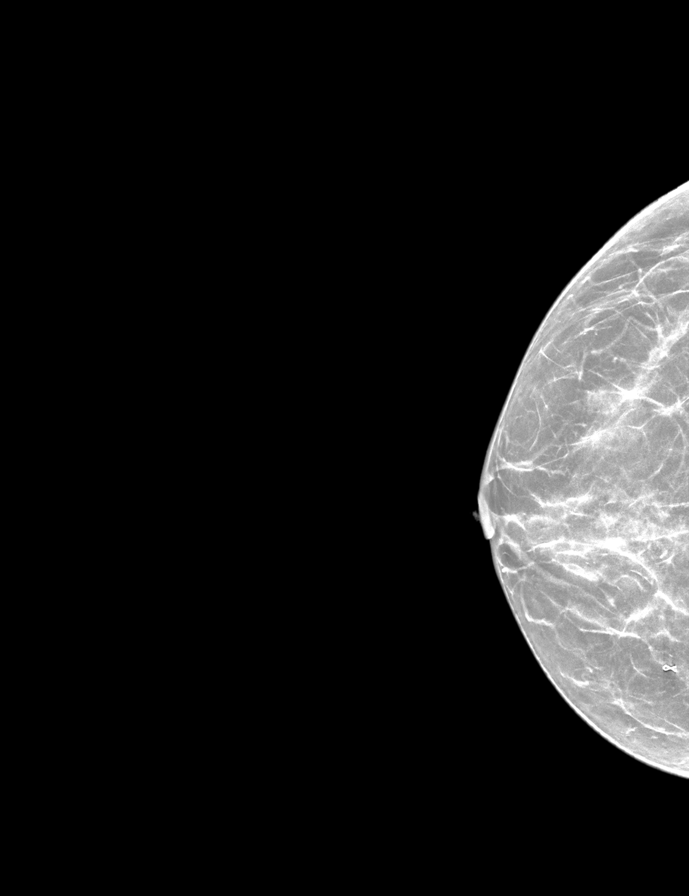

[L CC synth-2D]
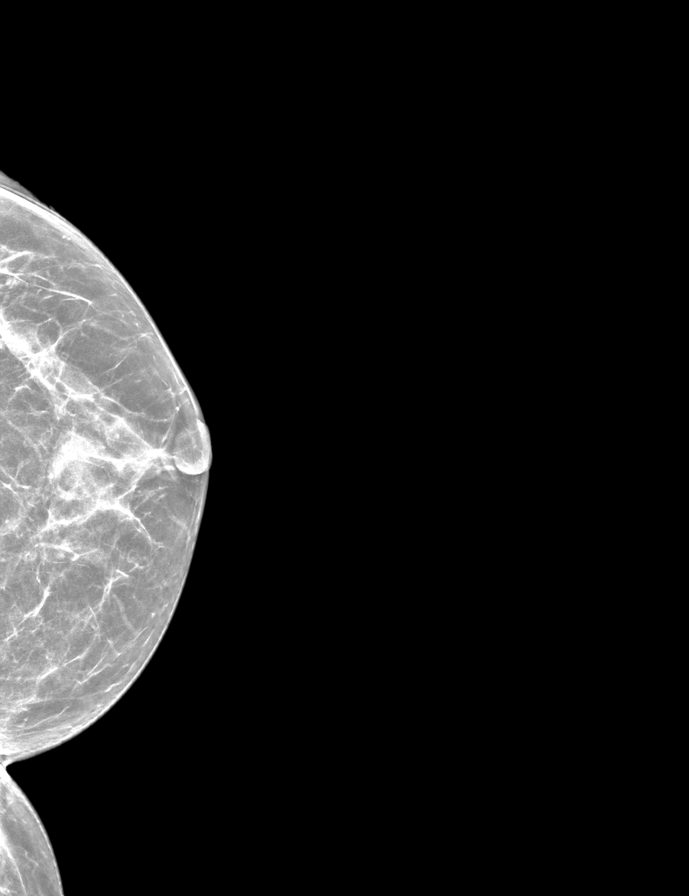

[R MLO synth-2D]
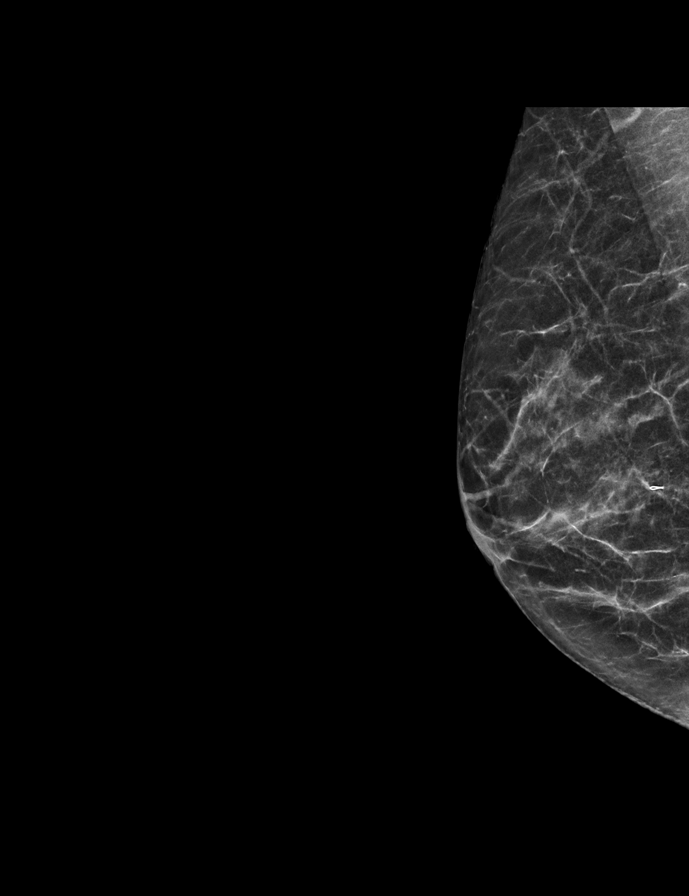

[L MLO tomo · 2 of 48 frames shown]
[frame 16/48]
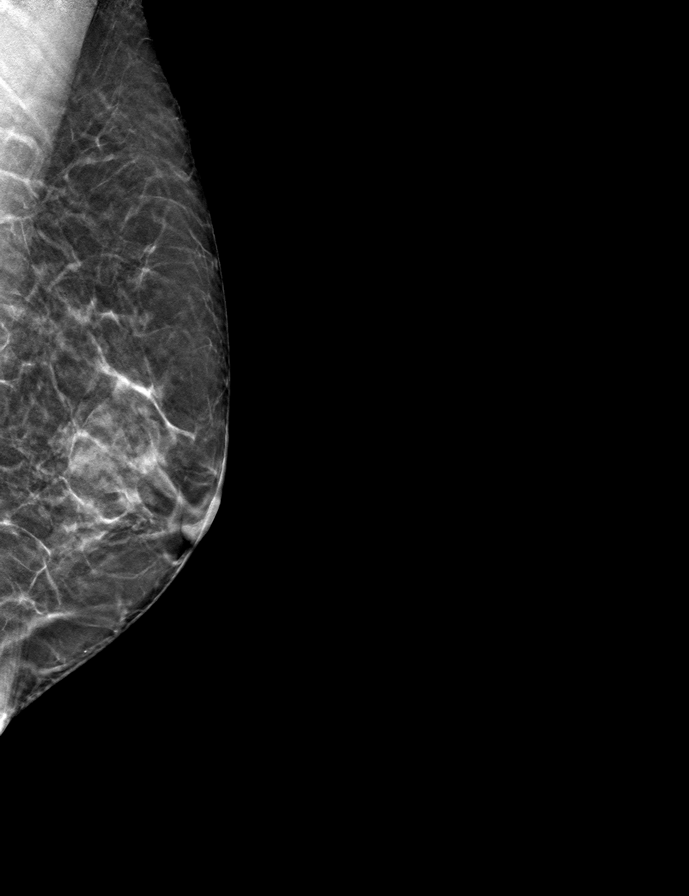
[frame 25/48]
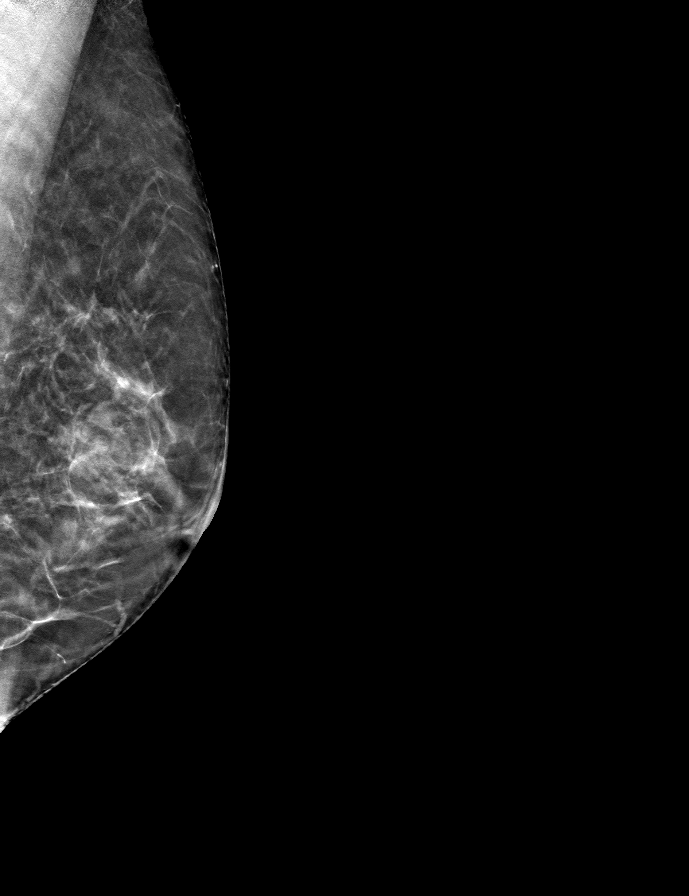

[R CC tomo · tomo slice 24/47.0]
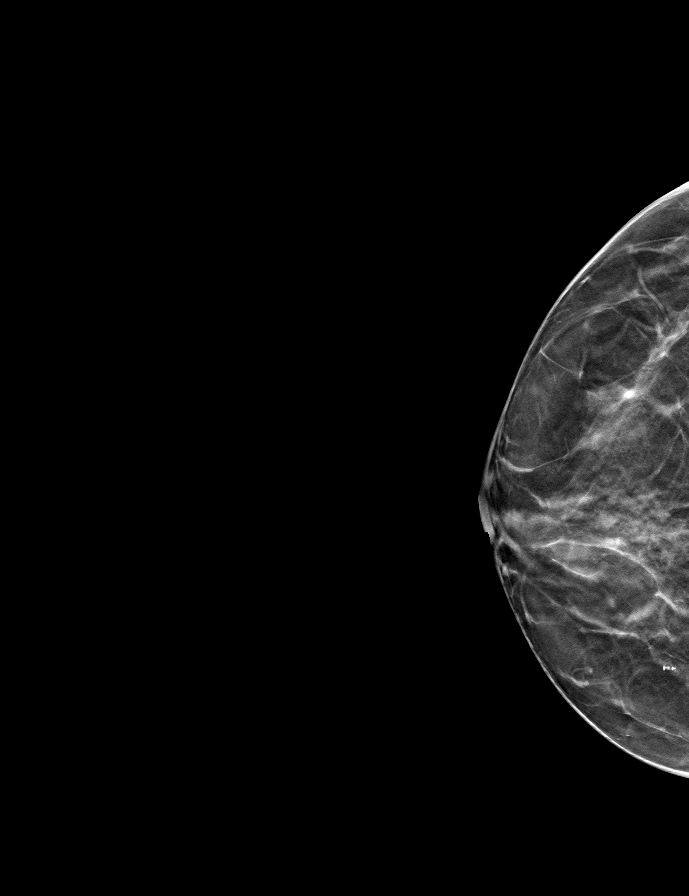

[R MLO tomo · tomo slice 24/47.0]
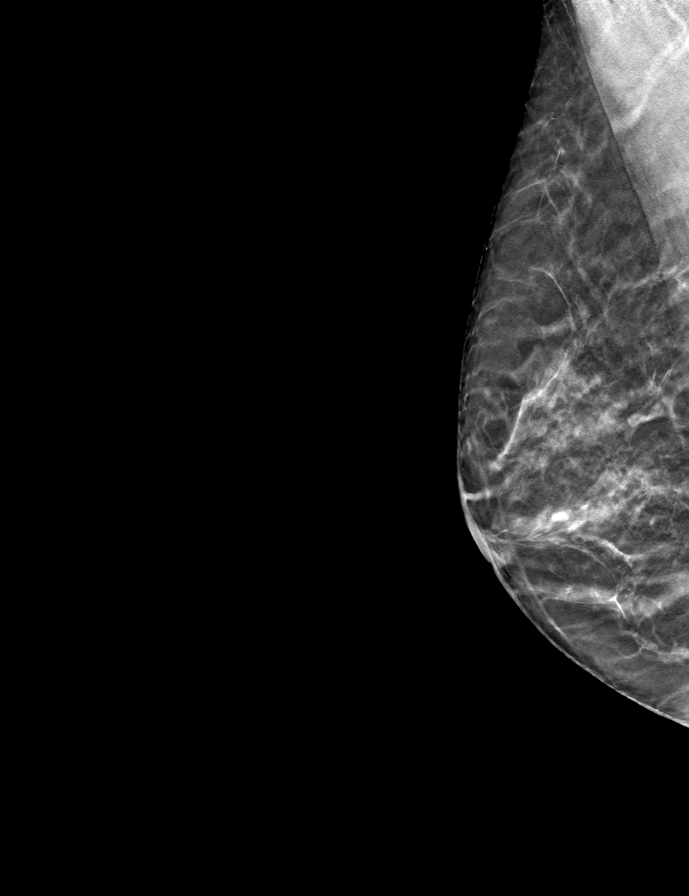

[L CC tomo · tomo slice 25/49.0]
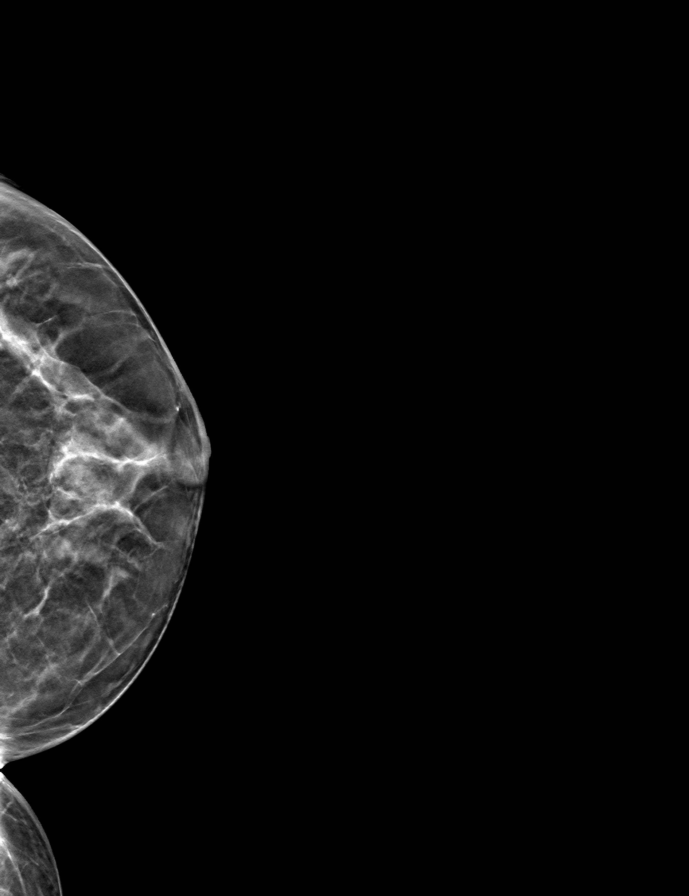

[9 of 24 positions shown; findings below may reference images not displayed]

ACR Breast Density Category b: There are scattered areas of
fibroglandular density.
FINDINGS: There are no findings suspicious for malignancy.
IMPRESSION: No mammographic evidence of malignancy. A result letter of this
screening mammogram will be mailed directly to the patient.

RECOMMENDATION:
Screening mammogram in one year. (Code:51-O-LD2)

BI-RADS CATEGORY  1: Negative.

## 2023-03-30 ENCOUNTER — Telehealth: Payer: Self-pay

## 2023-03-30 NOTE — Telephone Encounter (Signed)
Pt's daughter Selena Batten) LVM in triage line inquiring if pt could have fasting labs done prior to her appt scheduled w/ JC on 04/29/2023.  Please advise.

## 2023-03-31 ENCOUNTER — Ambulatory Visit: Payer: BC Managed Care – PPO | Admitting: Obstetrics and Gynecology

## 2023-03-31 NOTE — Telephone Encounter (Signed)
Will let them know, thanks. Also, daughter mentioned that the pt would like to fast for them, ok to schedule pt for lab only visit either earlier in the AM same day as appt or different day than scheduled appt per their preference?

## 2023-03-31 NOTE — Telephone Encounter (Signed)
We can drawn at her AEX this time but will need to be managed by a PCP

## 2023-03-31 NOTE — Telephone Encounter (Signed)
Pt's daughter per [facility] notified and voiced understanding and appreciation. AEX r/s for 04/23/2023 @ 900 to arrive at 0845. Routing to provider for final review and closing encounter.

## 2023-03-31 NOTE — Telephone Encounter (Signed)
Fasting labs should be done with a PCP. At her age, it is important she is seeing a PCP at least once a year. I would recommend she establish with one.

## 2023-03-31 NOTE — Telephone Encounter (Signed)
Spoke w/ pt's daughter Selena Batten) per DPR, she was advised of JC's advice and voiced understanding.   Inquiring if it would be possible that pt could have this performed here this yr since she is established now and will initiate the search for PCP.   Please advise.

## 2023-03-31 NOTE — Telephone Encounter (Signed)
Either is fine, I can always see her earlier that day so she doesn't have to come back.

## 2023-04-23 ENCOUNTER — Ambulatory Visit: Payer: BC Managed Care – PPO | Admitting: Radiology

## 2023-04-29 ENCOUNTER — Ambulatory Visit: Payer: BC Managed Care – PPO | Admitting: Radiology

## 2023-05-05 ENCOUNTER — Ambulatory Visit (INDEPENDENT_AMBULATORY_CARE_PROVIDER_SITE_OTHER): Payer: BC Managed Care – PPO | Admitting: Radiology

## 2023-05-05 ENCOUNTER — Encounter: Payer: Self-pay | Admitting: Radiology

## 2023-05-05 VITALS — BP 120/76 | HR 71 | Ht 59.5 in | Wt 91.0 lb

## 2023-05-05 DIAGNOSIS — Z01419 Encounter for gynecological examination (general) (routine) without abnormal findings: Secondary | ICD-10-CM

## 2023-05-05 DIAGNOSIS — E2839 Other primary ovarian failure: Secondary | ICD-10-CM | POA: Diagnosis not present

## 2023-05-05 DIAGNOSIS — Z789 Other specified health status: Secondary | ICD-10-CM | POA: Diagnosis not present

## 2023-05-05 DIAGNOSIS — E559 Vitamin D deficiency, unspecified: Secondary | ICD-10-CM

## 2023-05-05 DIAGNOSIS — E7889 Other lipoprotein metabolism disorders: Secondary | ICD-10-CM | POA: Diagnosis not present

## 2023-05-05 NOTE — Progress Notes (Signed)
   [name] 1966/02/14 [id number]   History: Postmenopausal 57 y.o. presents for annual exam with vietnamese interpreter. No gyn concerns. Doing well. Would like fasting labs today, does not have a PCP.    Gynecologic History Postmenopausal Last Pap: 03/25/22. Results were: normal Last mammogram: 06/02/22. Results were: normal Last colonoscopy: 04/14/17   Obstetric History OB History  Gravida Para Term Preterm AB Living  6 6 3  0 0 3  SAB IAB Ectopic Multiple Live Births  0 0 0 0 3    # Outcome Date GA Lbr Len/2nd Weight Sex Type Anes PTL Lv  6 Term           5 Term           4 Term           3 Para         LIV  2 Para         LIV  1 Para         LIV     The following portions of the patient's history were reviewed and updated as appropriate: allergies, current medications, past family history, past medical history, past social history, past surgical history, and problem list.  Review of Systems Pertinent items noted in HPI and remainder of comprehensive ROS otherwise negative.  Past medical history, past surgical history, family history and social history were all reviewed and documented in the EPIC chart.  Exam:  Vitals:   05/05/23 0828  BP: 120/76  Pulse: 71  SpO2: 99%  Weight: 91 lb (41.3 kg)  Height: 4' 11.5" (1.511 m)   Body mass index is 18.07 kg/m.  General appearance:  Normal, thin Thyroid:  Symmetrical, normal in size, without palpable masses or nodularity. Respiratory  Auscultation:  Clear without wheezing or rhonchi Cardiovascular  Auscultation:  Regular rate, without rubs, murmurs or gallops  Edema/varicosities:  Not grossly evident Abdominal  Soft,nontender, without masses, guarding or rebound.  Liver/spleen:  No organomegaly noted  Hernia:  None appreciated  Skin  Inspection:  Grossly normal Breasts: Examined lying and sitting.   Right: Without masses, retractions, nipple discharge or axillary adenopathy.   Left: Without masses,  retractions, nipple discharge or axillary adenopathy. Genitourinary   Inguinal/mons:  Normal without inguinal adenopathy  External genitalia:  Normal appearing vulva with no masses, tenderness, or lesions  BUS/Urethra/Skene's glands:  Normal  Vagina:  Normal appearing with normal color and discharge, no lesions. Atrophy: mild   Cervix:  Normal appearing without discharge or lesions  Uterus:  Normal in size, shape and contour.  Midline and mobile, nontender  Adnexa/parametria:     Rt: Normal in size, without masses or tenderness.   Lt: Normal in size, without masses or tenderness.  Anus and perineum: Normal    Raynelle Fanning, CMA present for exam  Assessment/Plan:   1. Well woman exam with routine gynecological exam (Primary) Pap 2026 Establish a PCP Mammo due 05/2023  2. Vitamin D deficiency - Vitamin D (25 hydroxy) - DG Bone Density; Future  3. Vegetarian - CBC - Comp Met (CMET)  4. Lipids abnormal - Lipid Profile  5. Estrogen deficiency - DG Bone Density; Future     Discussed SBE, colonoscopy and DEXA screening as directed. Recommend of exercise weekly, including weight bearing exercise. Encouraged the use of seatbelts and sunscreen.  Return in 1 year for annual or sooner prn.  Tanda Rockers WHNP-BC, 8:35 AM 05/05/2023

## 2023-05-05 NOTE — Patient Instructions (Signed)
Preventive Care 40-57 Years Old, Female Preventive care refers to lifestyle choices and visits with your health care provider that can promote health and wellness. Preventive care visits are also called wellness exams. What can I expect for my preventive care visit? Counseling Your health care provider may ask you questions about your: Medical history, including: Past medical problems. Family medical history. Pregnancy history. Current health, including: Menstrual cycle. Method of birth control. Emotional well-being. Home life and relationship well-being. Sexual activity and sexual health. Lifestyle, including: Alcohol, nicotine or tobacco, and drug use. Access to firearms. Diet, exercise, and sleep habits. Work and work environment. Sunscreen use. Safety issues such as seatbelt and bike helmet use. Physical exam Your health care provider will check your: Height and weight. These may be used to calculate your BMI (body mass index). BMI is a measurement that tells if you are at a healthy weight. Waist circumference. This measures the distance around your waistline. This measurement also tells if you are at a healthy weight and may help predict your risk of certain diseases, such as type 2 diabetes and high blood pressure. Heart rate and blood pressure. Body temperature. Skin for abnormal spots. What immunizations do I need?  Vaccines are usually given at various ages, according to a schedule. Your health care provider will recommend vaccines for you based on your age, medical history, and lifestyle or other factors, such as travel or where you work. What tests do I need? Screening Your health care provider may recommend screening tests for certain conditions. This may include: Lipid and cholesterol levels. Diabetes screening. This is done by checking your blood sugar (glucose) after you have not eaten for a while (fasting). Pelvic exam and Pap test. Hepatitis B test. Hepatitis C  test. HIV (human immunodeficiency virus) test. STI (sexually transmitted infection) testing, if you are at risk. Lung cancer screening. Colorectal cancer screening. Mammogram. Talk with your health care provider about when you should start having regular mammograms. This may depend on whether you have a family history of breast cancer. BRCA-related cancer screening. This may be done if you have a family history of breast, ovarian, tubal, or peritoneal cancers. Bone density scan. This is done to screen for osteoporosis. Talk with your health care provider about your test results, treatment options, and if necessary, the need for more tests. Follow these instructions at home: Eating and drinking  Eat a diet that includes fresh fruits and vegetables, whole grains, lean protein, and low-fat dairy products. Take vitamin and mineral supplements as recommended by your health care provider. Do not drink alcohol if: Your health care provider tells you not to drink. You are pregnant, may be pregnant, or are planning to become pregnant. If you drink alcohol: Limit how much you have to 0-1 drink a day. Know how much alcohol is in your drink. In the [place] one drink equals one 12 oz bottle of beer (355 mL), one 5 oz glass of wine (148 mL), or one 1 oz glass of hard liquor (44 mL). Lifestyle Brush your teeth every morning and night with fluoride toothpaste. Floss one time each day. Exercise for at least 30 minutes 5 or more days each week. Do not use any products that contain nicotine or tobacco. These products include cigarettes, chewing tobacco, and vaping devices, such as e-cigarettes. If you need help quitting, ask your health care provider. Do not use drugs. If you are sexually active, practice safe sex. Use a condom or other form of protection to   prevent STIs. If you do not wish to become pregnant, use a form of birth control. If you plan to become pregnant, see your health care provider for a  prepregnancy visit. Take aspirin only as told by your health care provider. Make sure that you understand how much to take and what form to take. Work with your health care provider to find out whether it is safe and beneficial for you to take aspirin daily. Find healthy ways to manage stress, such as: Meditation, yoga, or listening to music. Journaling. Talking to a trusted person. Spending time with friends and family. Minimize exposure to UV radiation to reduce your risk of skin cancer. Safety Always wear your seat belt while driving or riding in a vehicle. Do not drive: If you have been drinking alcohol. Do not ride with someone who has been drinking. When you are tired or distracted. While texting. If you have been using any mind-altering substances or drugs. Wear a helmet and other protective equipment during sports activities. If you have firearms in your house, make sure you follow all gun safety procedures. Seek help if you have been physically or sexually abused. What's next? Visit your health care provider once a year for an annual wellness visit. Ask your health care provider how often you should have your eyes and teeth checked. Stay up to date on all vaccines. This information is not intended to replace advice given to you by your health care provider. Make sure you discuss any questions you have with your health care provider. Document Revised: 10/30/2020 Document Reviewed: 10/30/2020 Elsevier Patient Education  2024 [employer]vier Inc.  

## 2023-05-06 LAB — COMPREHENSIVE METABOLIC PANEL
AG Ratio: 1.9 (calc) (ref 1.0–2.5)
ALT: 14 U/L (ref 6–29)
AST: 20 U/L (ref 10–35)
Albumin: 4.4 g/dL (ref 3.6–5.1)
Alkaline phosphatase (APISO): 79 U/L (ref 37–153)
BUN: 12 mg/dL (ref 7–25)
CO2: 25 mmol/L (ref 20–32)
Calcium: 9.1 mg/dL (ref 8.6–10.4)
Chloride: 104 mmol/L (ref 98–110)
Creat: 0.66 mg/dL (ref 0.50–1.03)
Globulin: 2.3 g/dL (ref 1.9–3.7)
Glucose, Bld: 94 mg/dL (ref 65–99)
Potassium: 4.2 mmol/L (ref 3.5–5.3)
Sodium: 139 mmol/L (ref 135–146)
Total Bilirubin: 0.9 mg/dL (ref 0.2–1.2)
Total Protein: 6.7 g/dL (ref 6.1–8.1)

## 2023-05-06 LAB — VITAMIN D 25 HYDROXY (VIT D DEFICIENCY, FRACTURES): Vit D, 25-Hydroxy: 27 ng/mL — ABNORMAL LOW (ref 30–100)

## 2023-05-06 LAB — LIPID PANEL
Cholesterol: 250 mg/dL — ABNORMAL HIGH (ref <200)
HDL: 67 mg/dL (ref >=50)
LDL Cholesterol (Calc): 154 mg/dL — ABNORMAL HIGH
Non-HDL Cholesterol (Calc): 183 mg/dL — ABNORMAL HIGH (ref <130)
Total CHOL/HDL Ratio: 3.7 (calc) (ref <5.0)
Triglycerides: 154 mg/dL — ABNORMAL HIGH (ref <150)

## 2023-05-06 LAB — CBC
HCT: 45.9 % — ABNORMAL HIGH (ref 35.0–45.0)
Hemoglobin: 15.2 g/dL (ref 11.7–15.5)
MCH: 30.9 pg (ref 27.0–33.0)
MCHC: 33.1 g/dL (ref 32.0–36.0)
MCV: 93.3 fL (ref 80.0–100.0)
MPV: 12.2 fL (ref 7.5–12.5)
Platelets: 230 10*3/uL (ref 140–400)
RBC: 4.92 10*6/uL (ref 3.80–5.10)
RDW: 12.4 % (ref 11.0–15.0)
WBC: 5.9 10*3/uL (ref 3.8–10.8)

## 2023-05-07 ENCOUNTER — Other Ambulatory Visit: Payer: Self-pay | Admitting: Radiology

## 2023-05-07 DIAGNOSIS — E559 Vitamin D deficiency, unspecified: Secondary | ICD-10-CM

## 2023-05-07 MED ORDER — VITAMIN D (ERGOCALCIFEROL) 1.25 MG (50000 UNIT) PO CAPS: 50000.0000 [IU] | ORAL_CAPSULE | ORAL | 0 refills | Status: AC

## 2023-05-07 NOTE — Progress Notes (Signed)
Rx for vit D sent x 12 weeks

## 2023-05-07 NOTE — Progress Notes (Signed)
Rx sent 

## 2023-05-10 ENCOUNTER — Ambulatory Visit
Admission: EM | Admit: 2023-05-10 | Discharge: 2023-05-10 | Disposition: A | Discharge status: Home / Self Care | Payer: BC Managed Care – PPO | Attending: Internal Medicine | Admitting: Internal Medicine

## 2023-05-10 DIAGNOSIS — W5501XA Bitten by cat, initial encounter: Secondary | ICD-10-CM

## 2023-05-10 DIAGNOSIS — S6981XA Other specified injuries of right wrist, hand and finger(s), initial encounter: Secondary | ICD-10-CM

## 2023-05-10 MED ORDER — AMOXICILLIN-POT CLAVULANATE 875-125 MG PO TABS: 1.0000 | ORAL_TABLET | Freq: Two times a day (BID) | ORAL | 0 refills | Status: AC

## 2023-05-10 NOTE — ED Triage Notes (Signed)
Pt presents with concerns of possible infection to the rt index finger. C/o swelling and pain. States she has taken abx from [place]. Yesterday, her finger started feeling sore.   Last wed, her cat bit her.

## 2023-05-10 NOTE — Discharge Instructions (Addendum)
Cat bite with infection in the right index finger. Tetanus is up to date so no injection needed for this. Will treat with the following:  Augmentin 875mg  twice daily for 10 days.  It will take about 10 days to get this completely treated.  Return to urgent care or PCP if symptoms worsen or fail to resolve.

## 2023-05-10 NOTE — ED Provider Notes (Signed)
[facility]    CSN: [id number] Arrival date & time: 05/10/23  1130      History   Chief Complaint Chief Complaint  Patient presents with   Animal Bite    CAT    HPI [name] is a 57 y.o. female.   57 year old female who presents to urgent care with complaints of a cat bite on the right index finger.  This happened about 7 to 10 days ago.  She initially washed the area and started taking a antibiotic that she got from [place].  It did not seem to be getting better therefore she started taking some Augmentin that a family member had.  She has now taken Augmentin for 3 days.  She has been taking it twice daily.  The area is still sore and warm to the touch but is improving from previous.  She denies fevers or chills.  She had a tetanus shot about 1 to 2 years ago.    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 Authorizing Provider  amoxicillin-clavulanate (AUGMENTIN) 875-125 MG tablet Take 1 tablet by mouth every 12 (twelve) hours for 10 days. 05/10/23 05/20/23 Yes Cheyrl Buley A, PA-C  Multiple Vitamin (MULTIVITAMIN)  tablet Take 1 tablet by mouth daily.    [provider]  Vitamin D, Ergocalciferol, (DRISDOL) 1.25 MG (50000 UNIT) CAPS capsule Take 1 capsule (50,000 Units total) by mouth every 7 (seven) days. 05/07/23   Chrzanowski, Lamona Curl, NP    Family History Family History  Problem Relation Age of Onset   Diabetes Mother    Heart defect Brother     Social History Social History   Tobacco Use   Smoking status: Never    Passive exposure: Current   Smokeless tobacco: Never  Vaping Use   Vaping status: Never Used  Substance Use Topics   Alcohol use: No    Alcohol/week: 0.0 standard drinks of alcohol   Drug use: No     Allergies   Patient has no known allergies.   Review of Systems Review of Systems  Constitutional:  Negative for chills and fever.  HENT:  Negative for ear pain and sore throat.   Eyes:  Negative for pain and visual disturbance.  Respiratory:  Negative for cough and  shortness of breath.   Cardiovascular:  Negative for chest pain and palpitations.  Gastrointestinal:  Negative for abdominal pain and vomiting.  Genitourinary:  Negative for dysuria and hematuria.  Musculoskeletal:  Negative for arthralgias and back pain.  Skin:  Negative for color change and rash.  Neurological:  Negative for seizures and syncope.  All other systems reviewed and are negative.    Physical Exam Triage Vital Signs ED Triage Vitals  Encounter Vitals Group     BP 05/10/23 1154 120/80     Systolic BP Percentile --      Diastolic BP Percentile --      Pulse Rate 05/10/23 1154 74     Resp 05/10/23 1154 16     Temp 05/10/23 1154 98 F (36.7 C)     Temp Source 05/10/23 1154 Oral     SpO2 05/10/23 1154 94 %     Weight 05/10/23 1157 90 lb 12.8 oz (41.2 kg)     Height --      Head Circumference --      Peak Flow --      Pain Score 05/10/23 1153 5     Pain Loc --      Pain Education --      Exclude from Growth Chart --    No data found.  Updated Vital Signs BP 120/80 (BP  Location: Right Arm)   Pulse 74   Temp 98 F (36.7 C) (Oral)   Resp 16   Wt 90 lb 12.8 oz (41.2 kg)   LMP 05/18/2016   SpO2 94%   BMI 18.03 kg/m   Visual Acuity Right Eye Distance:   Left Eye Distance:   Bilateral Distance:    Right Eye Near:   Left Eye Near:    Bilateral Near:     Physical Exam Vitals and nursing note reviewed.  Constitutional:      General: She is not in acute distress.    Appearance: She is well-developed.  HENT:     Head: Normocephalic and atraumatic.  Eyes:     Conjunctiva/sclera: Conjunctivae normal.  Cardiovascular:     Rate and Rhythm: Normal rate and regular rhythm.     Heart sounds: No murmur heard. Pulmonary:     Effort: Pulmonary effort is normal. No respiratory distress.     Breath sounds: Normal breath sounds.  Abdominal:     Palpations: Abdomen is soft.     Tenderness: There is no abdominal tenderness.  Musculoskeletal:        General: No swelling.     Cervical back: Neck supple.  Skin:    General: Skin is warm and dry.     Capillary Refill: Capillary refill takes less than 2 seconds.     Comments: Right index finger with small amount of erythema on the dorsal aspect, no fluctuance, no purulence, mild edema  Neurological:     Mental Status: She is alert.  Psychiatric:        Mood and Affect: Mood normal.      UC Treatments / Results  Labs (all labs ordered are listed, but only abnormal results are displayed) Labs Reviewed - No data to display  EKG   Radiology No results found.  Procedures Procedures (including critical care time)  Medications Ordered in UC Medications - No data to display  Initial Impression / Assessment and Plan / UC Course  I have reviewed the triage vital signs and the nursing notes.  Pertinent labs & imaging results that  were available during my care of the patient were reviewed by me and considered in my medical decision making (see chart for details).     Cat bite, initial encounter    Cat bite with infection in the right index finger. Tetanus is up to date so no injection needed for this. Will treat with the following:  Augmentin 875mg  twice daily for 10 days.  It will take about 10 days to get this completely treated.  Return to urgent care or PCP if symptoms worsen or fail to resolve.    Final Clinical Impressions(s) / UC Diagnoses   Final diagnoses:  Cat bite, initial encounter     Discharge Instructions      Cat bite with infection in the right index finger. Tetanus is up to date so no injection needed for this. Will treat with the following:  Augmentin 875mg  twice daily for 10 days.  It will take about 10 days to get this completely treated.  Return to urgent care or PCP if symptoms worsen or fail to resolve.     ED Prescriptions     Medication Sig Dispense Auth. Provider   amoxicillin-clavulanate (AUGMENTIN) 875-125 MG tablet Take 1 tablet by mouth every 12 (twelve) hours for 10 days. 20 tablet Landis Martins, [place]      PDMP not reviewed this encounter.   Landis Martins, [place] 05/10/23 1227

## 2023-06-03 ENCOUNTER — Other Ambulatory Visit: Payer: Self-pay | Admitting: Radiology

## 2023-06-03 DIAGNOSIS — Z1231 Encounter for screening mammogram for malignant neoplasm of breast: Secondary | ICD-10-CM

## 2023-06-18 ENCOUNTER — Ambulatory Visit
Admission: RE | Admit: 2023-06-18 | Discharge: 2023-06-18 | Disposition: A | Discharge status: Home / Self Care | Payer: BC Managed Care – PPO | Source: Ambulatory Visit | Attending: Radiology | Admitting: Radiology

## 2023-06-18 DIAGNOSIS — Z1231 Encounter for screening mammogram for malignant neoplasm of breast: Secondary | ICD-10-CM | POA: Diagnosis not present

## 2024-01-24 ENCOUNTER — Other Ambulatory Visit: Payer: BC Managed Care – PPO

## 2024-05-09 ENCOUNTER — Ambulatory Visit (INDEPENDENT_AMBULATORY_CARE_PROVIDER_SITE_OTHER): Payer: BC Managed Care – PPO | Admitting: Radiology

## 2024-05-09 ENCOUNTER — Encounter: Payer: Self-pay | Admitting: Radiology

## 2024-05-09 VITALS — BP 114/68 | HR 71 | Ht <= 58 in | Wt 92.0 lb

## 2024-05-09 DIAGNOSIS — Z1331 Encounter for screening for depression: Secondary | ICD-10-CM | POA: Diagnosis not present

## 2024-05-09 DIAGNOSIS — Z01419 Encounter for gynecological examination (general) (routine) without abnormal findings: Secondary | ICD-10-CM | POA: Diagnosis not present

## 2024-05-09 NOTE — Patient Instructions (Signed)
 Preventive Care 58-58 Years Old, Female  Preventive care refers to lifestyle choices and visits with your health care provider that can promote health and wellness. Preventive care visits are also called wellness exams.  What can I expect for my preventive care visit?  Counseling  Your health care provider may ask you questions about your:  Medical history, including:  Past medical problems.  Family medical history.  Pregnancy history.  Current health, including:  Menstrual cycle.  Method of birth control.  Emotional well-being.  Home life and relationship well-being.  Sexual activity and sexual health.  Lifestyle, including:  Alcohol, nicotine or tobacco, and drug use.  Access to firearms.  Diet, exercise, and sleep habits.  Work and work Astronomer.  Sunscreen use.  Safety issues such as seatbelt and bike helmet use.  Physical exam  Your health care provider will check your:  Height and weight. These may be used to calculate your BMI (body mass index). BMI is a measurement that tells if you are at a healthy weight.  Waist circumference. This measures the distance around your waistline. This measurement also tells if you are at a healthy weight and may help predict your risk of certain diseases, such as type 2 diabetes and high blood pressure.  Heart rate and blood pressure.  Body temperature.  Skin for abnormal spots.  What immunizations do I need?    Vaccines are usually given at various ages, according to a schedule. Your health care provider will recommend vaccines for you based on your age, medical history, and lifestyle or other factors, such as travel or where you work.  What tests do I need?  Screening  Your health care provider may recommend screening tests for certain conditions. This may include:  Lipid and cholesterol levels.  Diabetes screening. This is done by checking your blood sugar (glucose) after you have not eaten for a while (fasting).  Pelvic exam and Pap test.  Hepatitis B test.  Hepatitis C  test.  HIV (human immunodeficiency virus) test.  STI (sexually transmitted infection) testing, if you are at risk.  Lung cancer screening.  Colorectal cancer screening.  Mammogram. Talk with your health care provider about when you should start having regular mammograms. This may depend on whether you have a family history of breast cancer.  BRCA-related cancer screening. This may be done if you have a family history of breast, ovarian, tubal, or peritoneal cancers.  Bone density scan. This is done to screen for osteoporosis.  Talk with your health care provider about your test results, treatment options, and if necessary, the need for more tests.  Follow these instructions at home:  Eating and drinking    Eat a diet that includes fresh fruits and vegetables, whole grains, lean protein, and low-fat dairy products.  Take vitamin and mineral supplements as recommended by your health care provider.  Do not drink alcohol if:  Your health care provider tells you not to drink.  You are pregnant, may be pregnant, or are planning to become pregnant.  If you drink alcohol:  Limit how much you have to 0-1 drink a day.  Know how much alcohol is in your drink. In the [place] one drink equals one 12 oz bottle of beer (355 mL), one 5 oz glass of wine (148 mL), or one 1 oz glass of hard liquor (44 mL).  Lifestyle  Brush your teeth every morning and night with fluoride toothpaste. Floss one time each day.  Exercise for at least  30 minutes 5 or more days each week.  Do not use any products that contain nicotine or tobacco. These products include cigarettes, chewing tobacco, and vaping devices, such as e-cigarettes. If you need help quitting, ask your health care provider.  Do not use drugs.  If you are sexually active, practice safe sex. Use a condom or other form of protection to prevent STIs.  If you do not wish to become pregnant, use a form of birth control. If you plan to become pregnant, see your health care provider for a  prepregnancy visit.  Take aspirin only as told by your health care provider. Make sure that you understand how much to take and what form to take. Work with your health care provider to find out whether it is safe and beneficial for you to take aspirin daily.  Find healthy ways to manage stress, such as:  Meditation, yoga, or listening to music.  Journaling.  Talking to a trusted person.  Spending time with friends and family.  Minimize exposure to UV radiation to reduce your risk of skin cancer.  Safety  Always wear your seat belt while driving or riding in a vehicle.  Do not drive:  If you have been drinking alcohol. Do not ride with someone who has been drinking.  When you are tired or distracted.  While texting.  If you have been using any mind-altering substances or drugs.  Wear a helmet and other protective equipment during sports activities.  If you have firearms in your house, make sure you follow all gun safety procedures.  Seek help if you have been physically or sexually abused.  What's next?  Visit your health care provider once a year for an annual wellness visit.  Ask your health care provider how often you should have your eyes and teeth checked.  Stay up to date on all vaccines.  This information is not intended to replace advice given to you by your health care provider. Make sure you discuss any questions you have with your health care provider.  Document Revised: 10/30/2020 Document Reviewed: 10/30/2020  Elsevier Patient Education  2024 [employer].

## 2024-05-09 NOTE — Progress Notes (Signed)
 "  Thu Arrianna Catala 1965/10/05 [id number]   History: Postmenopausal 58 y.o. presents for annual exam Vietnamese video interpreter used. Patient was made aware at last visit she needed to find PCP, discussed with daughter as well when we called with lab results.Has not found one, would like a list of options. No PM bleeding, pain, menopausal symptoms or other gyn concerns today.   Gynecologic History Postmenopausal Last Pap: 2023. Results were: normal Last mammogram: 1/25. Results were: normal Last colonoscopy: 2018   Obstetric History OB History  Gravida Para Term Preterm AB Living  6 6 3  0 0 3  SAB IAB Ectopic Multiple Live Births  0 0 0 0 3    # Outcome Date GA Lbr Len/2nd Weight Sex Type Anes PTL Lv  6 Term           5 Term           4 Term           3 Para         LIV  2 Para         LIV  1 Para         LIV       05/09/2024    8:58 AM 05/05/2023    8:29 AM 09/24/2017    9:52 AM  Depression screen PHQ 2/9  Decreased Interest 0 0 0  Down, Depressed, Hopeless 0 0 0  PHQ - 2 Score 0 0 0     The following portions of the patient's history were reviewed and updated as appropriate: allergies, current medications, past family history, past medical history, past social history, past surgical history, and problem list.  Review of Systems Pertinent items noted in HPI and remainder of comprehensive ROS otherwise negative.  Past medical history, past surgical history, family history and social history were all reviewed and documented in the EPIC chart.  Exam:  Vitals:   05/09/24 0855  BP: 114/68  Pulse: 71  SpO2: 99%  Weight: 92 lb (41.7 kg)  Height: 4' 9.5 (1.461 m)   Body mass index is 19.56 kg/m.  General appearance:  Normal Thyroid :  Symmetrical, normal in size, without palpable masses or nodularity. Respiratory  Auscultation:  Clear without wheezing or rhonchi Cardiovascular  Auscultation:  Regular rate, without rubs, murmurs or gallops  Edema/varicosities:   Not grossly evident Abdominal  Soft,nontender, without masses, guarding or rebound.  Liver/spleen:  No organomegaly noted  Hernia:  None appreciated  Skin  Inspection:  Grossly normal Breasts: Examined lying and sitting.   Right: Without masses, retractions, nipple discharge or axillary adenopathy.   Left: Without masses, retractions, nipple discharge or axillary adenopathy. Genitourinary   Inguinal/mons:  Normal without inguinal adenopathy  External genitalia:  Normal appearing vulva with no masses, tenderness, or lesions  BUS/Urethra/Skene's glands:  Normal  Vagina:  Normal appearing with normal color and discharge, no lesions. Atrophy: mild   Cervix:  Normal appearing without discharge or lesions  Uterus:  Normal in size, shape and contour.  Midline and mobile, nontender  Adnexa/parametria:     Rt: Normal in size, without masses or tenderness.   Lt: Normal in size, without masses or tenderness.  Anus and perineum: Normal    Darice Hoit, CMA present for entire visit  Assessment/Plan:   1. Well woman exam with routine gynecological exam (Primary) Pap 2026 Mammo yearly Establish with PCP- will have labs done there. Explained via interpreter the difference between PCP and GYN annual  2.  Depression screening    Return in 1 year for annual or sooner prn.  Jakayden Cancio B WHNP-BC, 9:19 AM 05/09/2024 "

## 2024-05-16 ENCOUNTER — Ambulatory Visit (INDEPENDENT_AMBULATORY_CARE_PROVIDER_SITE_OTHER): Payer: Self-pay

## 2024-05-16 VITALS — BP 136/67 | HR 69 | Wt 95.8 lb

## 2024-05-16 DIAGNOSIS — E7800 Pure hypercholesterolemia, unspecified: Secondary | ICD-10-CM

## 2024-05-16 DIAGNOSIS — Z23 Encounter for immunization: Secondary | ICD-10-CM | POA: Diagnosis not present

## 2024-05-16 NOTE — Progress Notes (Signed)
" ° ° °  SUBJECTIVE:   Chief compliant/HPI: annual examination  [name] is a 58 y.o. who presents today for an annual exam.   History tabs reviewed and updated. Patient has been doing well without any concerns. States eating and sleeping well. Denies HA, dizziness, SOB, palpitation, abdominal pain, N/V/D or constipation She has been exercise daily Plan for a Mammogram on 06/13/23 which has been scheduled  Review of systems form reviewed.   OBJECTIVE:   BP 136/67   Pulse 69   Wt 95 lb 12.8 oz (43.5 kg)   LMP 05/18/2016   SpO2 96%   BMI 20.37 kg/m   Physical Exam Constitutional:      Appearance: Normal appearance.  Cardiovascular:     Rate and Rhythm: Normal rate.  Pulmonary:     Effort: Pulmonary effort is normal.     Breath sounds: Normal breath sounds.  Abdominal:     Palpations: Abdomen is soft.  Musculoskeletal:        General: Normal range of motion.  Skin:    General: Skin is warm.     Capillary Refill: Capillary refill takes less than 2 seconds.  Neurological:     Mental Status: She is alert and oriented to person, place, and time.      ASSESSMENT/PLAN:   Assessment & Plan Hypercholesterolemia History of abnormal lipid panel result last year - Repeat CMP, Lipid panel  Annual Examination  See AVS for age appropriate recommendations.   PHQ score 0, reviewed and discussed.  Blood pressure reviewed and at goal .  Married, Monogony  Not smoking, denies using recreational drug or drinking alcohol  The patient has been having bowel movement daily .   Considered the following items based upon USPSTF recommendations: Diabetes screening: ordered HIV testing:ordered Hepatitis C: ordered Hepatitis B:ordered Syphilis if at high risk: Not high risk GC/CT not at high risk and not ordered. Lipid panel (nonfasting or fasting) discussed based upon AHA recommendations and ordered.  Consider repeat every 4-6 years.  Reviewed risk factors for latent tuberculosis  and not indicated   Discussed family history, BRCA testing not indicated.  Cervical cancer screening: prior Pap reviewed, repeat due in 2028 Breast cancer screening: scheduled mammogram to be completed on 06/13/23 Colorectal cancer screening: Due on 2028   Follow up in 1  year or sooner if indicated.  MyChart Activation: Declined  In person interpreter presented for the entire visit  Houston Samuels, DO PGY1 Family Medicine Resident [facility]  "

## 2024-05-16 NOTE — Patient Instructions (Addendum)
" ° °  It was great to see you!  Our plans for today:  - We are checking some labs today, we will release these results to your MyChart.  Take care and seek immediate care sooner if you develop any concerns.    Th?t tuy?t khi ???c g?p b?n!  K? ho?ch c?a chng ti cho ngy hm nay: - Chng ti s? ki?m tra m?t s? k?t qu? xt nghi?m hm nay v s? c?p nh?t k?t qu? ln ti kho?n MyChart c?a b?n.  Hy gi? gn s?c kh?e v tm ??n bc s? ngay l?p t?c n?u b?n c b?t k? lo ng?i no.   Houston Coralee HAS PGY 1 Family Medicine Resident [facility]  [address] [place], [place] [address] Fax [phone] Phone [phone] 05/16/2024, 9:44 AM  "

## 2024-05-17 ENCOUNTER — Ambulatory Visit: Payer: Self-pay

## 2024-05-17 LAB — COMPREHENSIVE METABOLIC PANEL WITH GFR
ALT: 12 IU/L (ref 0–32)
AST: 19 IU/L (ref 0–40)
Albumin: 4.5 g/dL (ref 3.8–4.9)
Alkaline Phosphatase: 76 IU/L (ref 49–135)
BUN/Creatinine Ratio: 17 (ref 9–23)
BUN: 12 mg/dL (ref 6–24)
Bilirubin Total: 0.5 mg/dL (ref 0.0–1.2)
CO2: 24 mmol/L (ref 20–29)
Calcium: 9.8 mg/dL (ref 8.7–10.2)
Chloride: 103 mmol/L (ref 96–106)
Creatinine, Ser: 0.72 mg/dL (ref 0.57–1.00)
Globulin, Total: 2.1 g/dL (ref 1.5–4.5)
Glucose: 90 mg/dL (ref 70–99)
Potassium: 4.8 mmol/L (ref 3.5–5.2)
Sodium: 141 mmol/L (ref 134–144)
Total Protein: 6.6 g/dL (ref 6.0–8.5)
eGFR: 97 mL/min/1.73

## 2024-05-17 LAB — HEPATITIS B SURFACE ANTIBODY, QUANTITATIVE: Hepatitis B Surf Ab Quant: 13.4 m[IU]/mL

## 2024-05-17 LAB — LIPID PANEL
Chol/HDL Ratio: 3.8 ratio (ref 0.0–4.4)
Cholesterol, Total: 273 mg/dL — ABNORMAL HIGH (ref 100–199)
HDL: 72 mg/dL
LDL Chol Calc (NIH): 175 mg/dL — ABNORMAL HIGH (ref 0–99)
Triglycerides: 146 mg/dL (ref 0–149)
VLDL Cholesterol Cal: 26 mg/dL (ref 5–40)

## 2024-05-17 LAB — HIV ANTIBODY (ROUTINE TESTING W REFLEX): HIV Screen 4th Generation wRfx: NONREACTIVE

## 2024-05-17 LAB — HEPATITIS B CORE ANTIBODY, TOTAL: Hep B Core Total Ab: NEGATIVE

## 2024-05-17 LAB — HEPATITIS C ANTIBODY: Hep C Virus Ab: NONREACTIVE

## 2024-05-17 LAB — HEPATITIS B SURFACE ANTIGEN: Hepatitis B Surface Ag: NEGATIVE

## 2024-05-17 NOTE — Telephone Encounter (Signed)
 Attempt to call patient's daughter to notify about the abnormal lipid panel result today. While with abnormal labs patient w/o other comorbidity with ASCVD of 3.2%. No medication intervention needed at this time. Will recheck in 57m-1Y

## 2024-06-01 ENCOUNTER — Other Ambulatory Visit: Payer: Self-pay | Admitting: Radiology

## 2024-06-01 DIAGNOSIS — Z1231 Encounter for screening mammogram for malignant neoplasm of breast: Secondary | ICD-10-CM

## 2024-06-19 ENCOUNTER — Ambulatory Visit

## 2024-06-21 ENCOUNTER — Inpatient Hospital Stay: Admission: RE | Admit: 2024-06-21 | Discharge: 2024-06-21 | Attending: Radiology | Admitting: Radiology

## 2024-06-21 DIAGNOSIS — Z1231 Encounter for screening mammogram for malignant neoplasm of breast: Secondary | ICD-10-CM

## 2025-05-15 ENCOUNTER — Ambulatory Visit: Admitting: Radiology
